# Patient Record
Sex: Female | Born: 1940 | Race: White | Hispanic: No | Marital: Married | Smoking: Never smoker
Health system: Southern US, Community
[De-identification: ages and names within clinical notes are randomized; demographics above are authoritative.]

## PROBLEM LIST (undated history)

## (undated) DIAGNOSIS — E785 Hyperlipidemia, unspecified: Secondary | ICD-10-CM

## (undated) DIAGNOSIS — Z860100 Personal history of colon polyps, unspecified: Secondary | ICD-10-CM

## (undated) DIAGNOSIS — M2540 Effusion, unspecified joint: Secondary | ICD-10-CM

## (undated) DIAGNOSIS — R350 Frequency of micturition: Secondary | ICD-10-CM

## (undated) DIAGNOSIS — Z5189 Encounter for other specified aftercare: Secondary | ICD-10-CM

## (undated) DIAGNOSIS — R351 Nocturia: Secondary | ICD-10-CM

## (undated) DIAGNOSIS — K219 Gastro-esophageal reflux disease without esophagitis: Secondary | ICD-10-CM

## (undated) DIAGNOSIS — M1990 Unspecified osteoarthritis, unspecified site: Secondary | ICD-10-CM

## (undated) DIAGNOSIS — H269 Unspecified cataract: Secondary | ICD-10-CM

## (undated) DIAGNOSIS — M2550 Pain in unspecified joint: Secondary | ICD-10-CM

## (undated) DIAGNOSIS — K5790 Diverticulosis of intestine, part unspecified, without perforation or abscess without bleeding: Secondary | ICD-10-CM

## (undated) DIAGNOSIS — J302 Other seasonal allergic rhinitis: Secondary | ICD-10-CM

## (undated) HISTORY — PX: COLONOSCOPY: SHX174

---

## 1965-07-24 DIAGNOSIS — Z5189 Encounter for other specified aftercare: Secondary | ICD-10-CM

## 1965-07-24 DIAGNOSIS — IMO0001 Reserved for inherently not codable concepts without codable children: Secondary | ICD-10-CM

## 1965-07-24 HISTORY — DX: Reserved for inherently not codable concepts without codable children: IMO0001

## 1965-07-24 HISTORY — DX: Encounter for other specified aftercare: Z51.89

## 1971-07-25 HISTORY — PX: TONSILLECTOMY: SUR1361

## 1973-07-24 HISTORY — PX: BREAST SURGERY: SHX581

## 1997-12-30 ENCOUNTER — Other Ambulatory Visit: Admission: RE | Admit: 1997-12-30 | Discharge: 1997-12-30 | Payer: Self-pay | Admitting: Obstetrics & Gynecology

## 1999-02-23 ENCOUNTER — Other Ambulatory Visit: Admission: RE | Admit: 1999-02-23 | Discharge: 1999-02-23 | Payer: Self-pay | Admitting: Obstetrics & Gynecology

## 2000-03-29 ENCOUNTER — Other Ambulatory Visit: Admission: RE | Admit: 2000-03-29 | Discharge: 2000-03-29 | Payer: Self-pay | Admitting: Obstetrics & Gynecology

## 2001-04-23 ENCOUNTER — Other Ambulatory Visit: Admission: RE | Admit: 2001-04-23 | Discharge: 2001-04-23 | Payer: Self-pay | Admitting: Obstetrics & Gynecology

## 2002-05-19 ENCOUNTER — Other Ambulatory Visit: Admission: RE | Admit: 2002-05-19 | Discharge: 2002-05-19 | Payer: Self-pay | Admitting: Obstetrics & Gynecology

## 2003-05-25 ENCOUNTER — Other Ambulatory Visit: Admission: RE | Admit: 2003-05-25 | Discharge: 2003-05-25 | Payer: Self-pay | Admitting: Obstetrics & Gynecology

## 2003-07-10 ENCOUNTER — Ambulatory Visit (HOSPITAL_COMMUNITY): Admission: RE | Admit: 2003-07-10 | Discharge: 2003-07-10 | Payer: Self-pay | Admitting: Family Medicine

## 2004-06-10 ENCOUNTER — Other Ambulatory Visit: Admission: RE | Admit: 2004-06-10 | Discharge: 2004-06-10 | Payer: Self-pay | Admitting: Obstetrics & Gynecology

## 2005-07-21 ENCOUNTER — Other Ambulatory Visit: Admission: RE | Admit: 2005-07-21 | Discharge: 2005-07-21 | Payer: Self-pay | Admitting: Obstetrics & Gynecology

## 2006-07-16 ENCOUNTER — Emergency Department (HOSPITAL_COMMUNITY): Admission: EM | Admit: 2006-07-16 | Discharge: 2006-07-16 | Payer: Self-pay | Admitting: Emergency Medicine

## 2006-07-20 ENCOUNTER — Ambulatory Visit (HOSPITAL_COMMUNITY): Admission: RE | Admit: 2006-07-20 | Discharge: 2006-07-21 | Payer: Self-pay | Admitting: Surgery

## 2007-07-25 HISTORY — PX: CHOLECYSTECTOMY: SHX55

## 2008-07-24 HISTORY — PX: OTHER SURGICAL HISTORY: SHX169

## 2011-11-20 ENCOUNTER — Encounter (HOSPITAL_COMMUNITY): Payer: Self-pay

## 2011-11-27 ENCOUNTER — Encounter (HOSPITAL_COMMUNITY): Payer: Self-pay

## 2011-11-27 ENCOUNTER — Encounter (HOSPITAL_COMMUNITY)
Admission: RE | Admit: 2011-11-27 | Discharge: 2011-11-27 | Disposition: A | Discharge status: Home / Self Care | Payer: Medicare Other | Source: Ambulatory Visit | Attending: Orthopedic Surgery | Admitting: Orthopedic Surgery

## 2011-11-27 HISTORY — DX: Unspecified cataract: H26.9

## 2011-11-27 HISTORY — DX: Personal history of colon polyps, unspecified: Z86.0100

## 2011-11-27 HISTORY — DX: Gastro-esophageal reflux disease without esophagitis: K21.9

## 2011-11-27 HISTORY — DX: Nocturia: R35.1

## 2011-11-27 HISTORY — DX: Diverticulosis of intestine, part unspecified, without perforation or abscess without bleeding: K57.90

## 2011-11-27 HISTORY — DX: Personal history of colonic polyps: Z86.010

## 2011-11-27 HISTORY — DX: Unspecified osteoarthritis, unspecified site: M19.90

## 2011-11-27 HISTORY — DX: Hyperlipidemia, unspecified: E78.5

## 2011-11-27 HISTORY — DX: Pain in unspecified joint: M25.50

## 2011-11-27 HISTORY — DX: Effusion, unspecified joint: M25.40

## 2011-11-27 HISTORY — DX: Encounter for other specified aftercare: Z51.89

## 2011-11-27 HISTORY — DX: Frequency of micturition: R35.0

## 2011-11-27 HISTORY — DX: Other seasonal allergic rhinitis: J30.2

## 2011-11-27 LAB — URINE MICROSCOPIC-ADD ON

## 2011-11-27 LAB — COMPREHENSIVE METABOLIC PANEL
ALT: 22 U/L (ref 0–35)
AST: 22 U/L (ref 0–37)
CO2: 24 mEq/L (ref 19–32)
Calcium: 10.4 mg/dL (ref 8.4–10.5)
Chloride: 104 mEq/L (ref 96–112)
Creatinine, Ser: 0.78 mg/dL (ref 0.50–1.10)
GFR calc Af Amer: >90 mL/min (ref >90)
GFR calc non Af Amer: 82 mL/min — ABNORMAL LOW (ref >90)
Glucose, Bld: 91 mg/dL (ref 70–99)
Total Bilirubin: 0.8 mg/dL (ref 0.3–1.2)

## 2011-11-27 LAB — CBC
HCT: 42.9 % (ref 36.0–46.0)
Hemoglobin: 14.1 g/dL (ref 12.0–15.0)
MCH: 30.3 pg (ref 26.0–34.0)
MCV: 92.3 fL (ref 78.0–100.0)
Platelets: 179 10*3/uL (ref 150–400)
RBC: 4.65 MIL/uL (ref 3.87–5.11)
WBC: 4.2 10*3/uL (ref 4.0–10.5)

## 2011-11-27 LAB — DIFFERENTIAL
Basophils Absolute: 0 10*3/uL (ref 0.0–0.1)
Basophils Relative: 1 % (ref 0–1)
Eosinophils Relative: 3 % (ref 0–5)
Monocytes Absolute: 0.3 10*3/uL (ref 0.1–1.0)
Neutro Abs: 1.8 10*3/uL (ref 1.7–7.7)

## 2011-11-27 LAB — URINALYSIS, ROUTINE W REFLEX MICROSCOPIC
Bilirubin Urine: NEGATIVE
Glucose, UA: NEGATIVE mg/dL
Specific Gravity, Urine: 1.007 (ref 1.005–1.030)

## 2011-11-27 LAB — PROTIME-INR: Prothrombin Time: 13 seconds (ref 11.6–15.2)

## 2011-11-27 LAB — SURGICAL PCR SCREEN
MRSA, PCR: NEGATIVE
Staphylococcus aureus: NEGATIVE

## 2011-11-27 MED ORDER — CHLORHEXIDINE GLUCONATE 4 % EX LIQD: 60.0000 mL | Freq: Once | CUTANEOUS | Status: DC

## 2011-11-27 NOTE — Pre-Procedure Instructions (Signed)
20 CICI RODRIGES  11/27/2011   Your procedure is scheduled on:  Mon, May 13 @ 0730 AM  Report to [facility] at 0530 AM.  Call this number if you have problems the morning of surgery: [phone]   Remember:   Do not eat food:After Midnight.  May have clear liquids: up to 4 Hours before arrival.(until 1:30 am)  Clear liquids include soda, tea, black coffee, apple or grape juice, broth,water  Take these medicines the morning of surgery with A SIP OF WATER:    Do not wear jewelry, make-up or nail polish.  Do not wear lotions, powders, or perfumes.   Do not bring valuables to the hospital.  Contacts, dentures or bridgework may not be worn into surgery.  Leave suitcase in the car. After surgery it may be brought to your room.  For patients admitted to the hospital, checkout time is 11:00 AM the day of discharge.   Special Instructions: CHG Shower Use Special Wash: 1/2 bottle night before surgery and 1/2 bottle morning of surgery.   Please read over the following fact sheets that you were given: Pain Booklet, Coughing and Deep Breathing, Blood Transfusion Information, Total Joint Packet, MRSA Information and Surgical Site Infection Prevention

## 2011-11-27 NOTE — Progress Notes (Addendum)
Dr.Thomas Wall saw pt 15+yrs ago d/t a womens clinic advertised and pt went  Denies ever having an echo/stress test/heart cath  Medical MD is Dr.William Holley Bouche on [address] with [facility] [employer]

## 2011-12-03 MED ORDER — VANCOMYCIN HCL 1000 MG IV SOLR
1500.0000 mg | INTRAVENOUS | Status: DC
  Filled 2011-12-03: qty 1500

## 2011-12-04 ENCOUNTER — Encounter (HOSPITAL_COMMUNITY): Payer: Self-pay | Admitting: Anesthesiology

## 2011-12-04 ENCOUNTER — Encounter (HOSPITAL_COMMUNITY)
Admission: RE | Disposition: A | Discharge status: Home Health | Payer: Self-pay | Source: Ambulatory Visit | Attending: Orthopedic Surgery

## 2011-12-04 ENCOUNTER — Inpatient Hospital Stay (HOSPITAL_COMMUNITY)
Admission: RE | Admit: 2011-12-04 | Discharge: 2011-12-06 | DRG: 470 | Disposition: A | Discharge status: Home Health | Payer: Medicare Other | Source: Ambulatory Visit | Attending: Orthopedic Surgery | Admitting: Orthopedic Surgery

## 2011-12-04 ENCOUNTER — Encounter (HOSPITAL_COMMUNITY): Payer: Self-pay | Admitting: *Deleted

## 2011-12-04 ENCOUNTER — Ambulatory Visit (HOSPITAL_COMMUNITY): Payer: Medicare Other | Admitting: Anesthesiology

## 2011-12-04 DIAGNOSIS — D62 Acute posthemorrhagic anemia: Secondary | ICD-10-CM | POA: Diagnosis not present

## 2011-12-04 DIAGNOSIS — J309 Allergic rhinitis, unspecified: Secondary | ICD-10-CM | POA: Diagnosis present

## 2011-12-04 DIAGNOSIS — Z01812 Encounter for preprocedural laboratory examination: Secondary | ICD-10-CM

## 2011-12-04 DIAGNOSIS — E785 Hyperlipidemia, unspecified: Secondary | ICD-10-CM | POA: Diagnosis present

## 2011-12-04 DIAGNOSIS — IMO0002 Reserved for concepts with insufficient information to code with codable children: Principal | ICD-10-CM | POA: Diagnosis present

## 2011-12-04 DIAGNOSIS — Z7982 Long term (current) use of aspirin: Secondary | ICD-10-CM

## 2011-12-04 DIAGNOSIS — M1711 Unilateral primary osteoarthritis, right knee: Secondary | ICD-10-CM

## 2011-12-04 DIAGNOSIS — M1710 Unilateral primary osteoarthritis, unspecified knee: Principal | ICD-10-CM | POA: Diagnosis present

## 2011-12-04 HISTORY — PX: TOTAL KNEE ARTHROPLASTY: SHX125

## 2011-12-04 SURGERY — ARTHROPLASTY, KNEE, TOTAL
Anesthesia: General | Site: Knee | Laterality: Right | Wound class: Clean

## 2011-12-04 MED ORDER — METHOCARBAMOL 500 MG PO TABS
500.0000 mg | ORAL_TABLET | Freq: Four times a day (QID) | ORAL | Status: DC | PRN
  Administered 2011-12-04 (×3): 500 mg via ORAL
  Filled 2011-12-04 (×3): qty 1

## 2011-12-04 MED ORDER — ACETAMINOPHEN 10 MG/ML IV SOLN
INTRAVENOUS | Status: AC
  Filled 2011-12-04: qty 100

## 2011-12-04 MED ORDER — VANCOMYCIN HCL 1000 MG IV SOLR
1000.0000 mg | INTRAVENOUS | Status: DC | PRN
  Administered 2011-12-04: 1500 mg via INTRAVENOUS

## 2011-12-04 MED ORDER — ONDANSETRON HCL 4 MG/2ML IJ SOLN
4.0000 mg | Freq: Four times a day (QID) | INTRAMUSCULAR | Status: DC | PRN
  Administered 2011-12-04 – 2011-12-05 (×2): 4 mg via INTRAVENOUS
  Filled 2011-12-04 (×3): qty 2

## 2011-12-04 MED ORDER — OXYCODONE HCL 5 MG PO TABS
5.0000 mg | ORAL_TABLET | ORAL | Status: DC | PRN
  Administered 2011-12-04 – 2011-12-06 (×12): 10 mg via ORAL
  Filled 2011-12-04 (×11): qty 2

## 2011-12-04 MED ORDER — DIPHENHYDRAMINE HCL 12.5 MG/5ML PO ELIX
12.5000 mg | ORAL_SOLUTION | ORAL | Status: DC | PRN
  Administered 2011-12-04 – 2011-12-06 (×8): 25 mg via ORAL
  Filled 2011-12-04 (×9): qty 10

## 2011-12-04 MED ORDER — VANCOMYCIN HCL IN DEXTROSE 1-5 GM/200ML-% IV SOLN
1000.0000 mg | Freq: Two times a day (BID) | INTRAVENOUS | Status: AC
  Administered 2011-12-04: 1000 mg via INTRAVENOUS
  Filled 2011-12-04: qty 200

## 2011-12-04 MED ORDER — SODIUM CHLORIDE 0.9 % IV SOLN: INTRAVENOUS | Status: DC

## 2011-12-04 MED ORDER — OXYCODONE HCL 10 MG PO TB12
10.0000 mg | ORAL_TABLET | Freq: Two times a day (BID) | ORAL | Status: DC
  Administered 2011-12-04 – 2011-12-06 (×5): 10 mg via ORAL
  Filled 2011-12-04 (×5): qty 1

## 2011-12-04 MED ORDER — ACETAMINOPHEN 325 MG PO TABS: 650.0000 mg | ORAL_TABLET | Freq: Four times a day (QID) | ORAL | Status: DC | PRN

## 2011-12-04 MED ORDER — PROPOFOL 10 MG/ML IV EMUL
INTRAVENOUS | Status: DC | PRN
  Administered 2011-12-04: 200 mg via INTRAVENOUS

## 2011-12-04 MED ORDER — ONDANSETRON HCL 4 MG PO TABS: 4.0000 mg | ORAL_TABLET | Freq: Four times a day (QID) | ORAL | Status: DC | PRN

## 2011-12-04 MED ORDER — BISACODYL 5 MG PO TBEC: 5.0000 mg | DELAYED_RELEASE_TABLET | Freq: Every day | ORAL | Status: DC | PRN

## 2011-12-04 MED ORDER — ALUM & MAG HYDROXIDE-SIMETH 200-200-20 MG/5ML PO SUSP: 30.0000 mL | ORAL | Status: DC | PRN

## 2011-12-04 MED ORDER — METHOCARBAMOL 100 MG/ML IJ SOLN
500.0000 mg | Freq: Four times a day (QID) | INTRAVENOUS | Status: DC | PRN
  Filled 2011-12-04: qty 5

## 2011-12-04 MED ORDER — HYDROMORPHONE HCL PF 1 MG/ML IJ SOLN
INTRAMUSCULAR | Status: AC
  Filled 2011-12-04: qty 1

## 2011-12-04 MED ORDER — MIDAZOLAM HCL 5 MG/5ML IJ SOLN
INTRAMUSCULAR | Status: DC | PRN
  Administered 2011-12-04: 2 mg via INTRAVENOUS

## 2011-12-04 MED ORDER — METOCLOPRAMIDE HCL 5 MG/ML IJ SOLN: 5.0000 mg | Freq: Three times a day (TID) | INTRAMUSCULAR | Status: DC | PRN

## 2011-12-04 MED ORDER — CELECOXIB 200 MG PO CAPS
200.0000 mg | ORAL_CAPSULE | Freq: Two times a day (BID) | ORAL | Status: DC
  Administered 2011-12-04 – 2011-12-06 (×5): 200 mg via ORAL
  Filled 2011-12-04 (×6): qty 1

## 2011-12-04 MED ORDER — MEPERIDINE HCL 25 MG/ML IJ SOLN: 6.2500 mg | INTRAMUSCULAR | Status: DC | PRN

## 2011-12-04 MED ORDER — LACTATED RINGERS IV SOLN
INTRAVENOUS | Status: DC | PRN
  Administered 2011-12-04 (×2): via INTRAVENOUS

## 2011-12-04 MED ORDER — LORATADINE 10 MG PO TABS: 10.0000 mg | ORAL_TABLET | Freq: Every day | ORAL | Status: DC

## 2011-12-04 MED ORDER — DIPHENHYDRAMINE HCL 50 MG/ML IJ SOLN: 12.5000 mg | Freq: Four times a day (QID) | INTRAMUSCULAR | Status: DC | PRN

## 2011-12-04 MED ORDER — ENOXAPARIN SODIUM 30 MG/0.3ML ~~LOC~~ SOLN
30.0000 mg | Freq: Two times a day (BID) | SUBCUTANEOUS | Status: DC
  Administered 2011-12-05 – 2011-12-06 (×3): 30 mg via SUBCUTANEOUS
  Filled 2011-12-04 (×5): qty 0.3

## 2011-12-04 MED ORDER — EPHEDRINE SULFATE 50 MG/ML IJ SOLN
INTRAMUSCULAR | Status: DC | PRN
  Administered 2011-12-04: 10 mg via INTRAVENOUS
  Administered 2011-12-04: 5 mg via INTRAVENOUS

## 2011-12-04 MED ORDER — BUPIVACAINE-EPINEPHRINE PF 0.25-1:200000 % IJ SOLN
INTRAMUSCULAR | Status: DC | PRN
  Administered 2011-12-04: 20 mL

## 2011-12-04 MED ORDER — PHENOL 1.4 % MT LIQD: 1.0000 | OROMUCOSAL | Status: DC | PRN

## 2011-12-04 MED ORDER — BUPIVACAINE 0.25 % ON-Q PUMP SINGLE CATH 300ML
INJECTION | Status: DC | PRN
  Administered 2011-12-04: 300 mL

## 2011-12-04 MED ORDER — ROPIVACAINE HCL 5 MG/ML IJ SOLN
INTRAMUSCULAR | Status: DC | PRN
  Administered 2011-12-04: 30 mL

## 2011-12-04 MED ORDER — MENTHOL 3 MG MT LOZG: 1.0000 | LOZENGE | OROMUCOSAL | Status: DC | PRN

## 2011-12-04 MED ORDER — ACETAMINOPHEN 650 MG RE SUPP: 650.0000 mg | Freq: Four times a day (QID) | RECTAL | Status: DC | PRN

## 2011-12-04 MED ORDER — FLEET ENEMA 7-19 GM/118ML RE ENEM: 1.0000 | ENEMA | Freq: Once | RECTAL | Status: AC | PRN

## 2011-12-04 MED ORDER — DIPHENHYDRAMINE HCL 50 MG/ML IJ SOLN
INTRAMUSCULAR | Status: AC
  Administered 2011-12-04: 12.5 mg
  Filled 2011-12-04: qty 1

## 2011-12-04 MED ORDER — ACETAMINOPHEN 10 MG/ML IV SOLN
INTRAVENOUS | Status: DC | PRN
  Administered 2011-12-04: 1000 mg via INTRAVENOUS

## 2011-12-04 MED ORDER — SIMVASTATIN 40 MG PO TABS: 40.0000 mg | ORAL_TABLET | Freq: Every day | ORAL | Status: DC

## 2011-12-04 MED ORDER — SENNOSIDES-DOCUSATE SODIUM 8.6-50 MG PO TABS: 1.0000 | ORAL_TABLET | Freq: Every evening | ORAL | Status: DC | PRN

## 2011-12-04 MED ORDER — DOCUSATE SODIUM 100 MG PO CAPS
100.0000 mg | ORAL_CAPSULE | Freq: Two times a day (BID) | ORAL | Status: DC
  Administered 2011-12-04 – 2011-12-06 (×4): 100 mg via ORAL
  Filled 2011-12-04 (×6): qty 1

## 2011-12-04 MED ORDER — SIMVASTATIN 40 MG PO TABS
40.0000 mg | ORAL_TABLET | Freq: Every day | ORAL | Status: DC
  Administered 2011-12-05: 40 mg via ORAL
  Filled 2011-12-04 (×3): qty 1

## 2011-12-04 MED ORDER — LIDOCAINE HCL (CARDIAC) 20 MG/ML IV SOLN
INTRAVENOUS | Status: DC | PRN
  Administered 2011-12-04: 100 mg via INTRAVENOUS

## 2011-12-04 MED ORDER — ZOLPIDEM TARTRATE 5 MG PO TABS: 5.0000 mg | ORAL_TABLET | Freq: Every evening | ORAL | Status: DC | PRN

## 2011-12-04 MED ORDER — LORATADINE 10 MG PO TABS
10.0000 mg | ORAL_TABLET | Freq: Every day | ORAL | Status: DC
  Administered 2011-12-05 – 2011-12-06 (×2): 10 mg via ORAL
  Filled 2011-12-04 (×3): qty 1

## 2011-12-04 MED ORDER — HYDROMORPHONE HCL PF 1 MG/ML IJ SOLN
0.2500 mg | INTRAMUSCULAR | Status: DC | PRN
Start: 2011-12-04 — End: 2011-12-04
  Administered 2011-12-04: 0.5 mg via INTRAVENOUS

## 2011-12-04 MED ORDER — PROMETHAZINE HCL 25 MG/ML IJ SOLN: 6.2500 mg | INTRAMUSCULAR | Status: DC | PRN

## 2011-12-04 MED ORDER — METOCLOPRAMIDE HCL 10 MG PO TABS: 5.0000 mg | ORAL_TABLET | Freq: Three times a day (TID) | ORAL | Status: DC | PRN

## 2011-12-04 MED ORDER — SODIUM CHLORIDE 0.9 % IR SOLN
Status: DC | PRN
  Administered 2011-12-04: 1000 mL

## 2011-12-04 MED ORDER — FENTANYL CITRATE 0.05 MG/ML IJ SOLN
INTRAMUSCULAR | Status: DC | PRN
  Administered 2011-12-04 (×5): 50 ug via INTRAVENOUS

## 2011-12-04 MED ORDER — HYDROMORPHONE HCL PF 1 MG/ML IJ SOLN
0.5000 mg | INTRAMUSCULAR | Status: DC | PRN
  Administered 2011-12-04 (×2): 1 mg via INTRAVENOUS
  Administered 2011-12-04: 0.5 mg via INTRAVENOUS
  Filled 2011-12-04 (×2): qty 1

## 2011-12-04 MED ORDER — BUPIVACAINE ON-Q PAIN PUMP (FOR ORDER SET NO CHG)
INJECTION | Status: DC
  Filled 2011-12-04: qty 1

## 2011-12-04 MED ORDER — LACTATED RINGERS IV SOLN: INTRAVENOUS | Status: DC

## 2011-12-04 MED ORDER — BUPIVACAINE 0.25 % ON-Q PUMP SINGLE CATH 300ML
300.0000 mL | INJECTION | Status: DC
  Filled 2011-12-04: qty 300

## 2011-12-04 MED ORDER — HYDROMORPHONE HCL PF 1 MG/ML IJ SOLN
0.2500 mg | INTRAMUSCULAR | Status: DC | PRN
  Administered 2011-12-04 (×6): 0.5 mg via INTRAVENOUS

## 2011-12-04 SURGICAL SUPPLY — 61 items
BANDAGE ELASTIC 4 VELCRO ST LF (GAUZE/BANDAGES/DRESSINGS) ×2 IMPLANT
BANDAGE ELASTIC 6 VELCRO ST LF (GAUZE/BANDAGES/DRESSINGS) ×2 IMPLANT
BANDAGE ESMARK 6X9 LF (GAUZE/BANDAGES/DRESSINGS) ×1 IMPLANT
BLADE SAGITTAL 13X1.27X60 (BLADE) ×2 IMPLANT
BLADE SAW SGTL 83.5X18.5 (BLADE) ×2 IMPLANT
BNDG ESMARK 6X9 LF (GAUZE/BANDAGES/DRESSINGS) ×2
BOWL SMART MIX CTS (DISPOSABLE) ×4 IMPLANT
CATH KIT ON Q 5IN SLV (PAIN MANAGEMENT) ×2 IMPLANT
CEMENT BONE SIMPLEX SPEEDSET (Cement) ×2 IMPLANT
CLOTH BEACON ORANGE TIMEOUT ST (SAFETY) ×2 IMPLANT
COVER BACK TABLE 24X17X13 BIG (DRAPES) ×2 IMPLANT
COVER SURGICAL LIGHT HANDLE (MISCELLANEOUS) ×4 IMPLANT
CUFF TOURNIQUET SINGLE 34IN LL (TOURNIQUET CUFF) ×2 IMPLANT
DRAPE EXTREMITY T 121X128X90 (DRAPE) ×2 IMPLANT
DRAPE INCISE IOBAN 66X45 STRL (DRAPES) ×4 IMPLANT
DRAPE PROXIMA HALF (DRAPES) ×2 IMPLANT
DRAPE U-SHAPE 47X51 STRL (DRAPES) ×2 IMPLANT
DRSG ADAPTIC 3X8 NADH LF (GAUZE/BANDAGES/DRESSINGS) ×2 IMPLANT
DRSG PAD ABDOMINAL 8X10 ST (GAUZE/BANDAGES/DRESSINGS) ×2 IMPLANT
DURAPREP 26ML APPLICATOR (WOUND CARE) ×4 IMPLANT
ELECT REM PT RETURN 9FT ADLT (ELECTROSURGICAL) ×2
ELECTRODE REM PT RTRN 9FT ADLT (ELECTROSURGICAL) ×1 IMPLANT
EVACUATOR 1/8 PVC DRAIN (DRAIN) ×2 IMPLANT
GLOVE BIOGEL M 7.0 STRL (GLOVE) IMPLANT
GLOVE BIOGEL PI IND STRL 7.5 (GLOVE) IMPLANT
GLOVE BIOGEL PI IND STRL 8.5 (GLOVE) ×2 IMPLANT
GLOVE BIOGEL PI INDICATOR 7.5 (GLOVE)
GLOVE BIOGEL PI INDICATOR 8.5 (GLOVE) ×2
GLOVE SURG ORTHO 8.0 STRL STRW (GLOVE) ×4 IMPLANT
GOWN PREVENTION PLUS XLARGE (GOWN DISPOSABLE) ×4 IMPLANT
GOWN STRL NON-REIN LRG LVL3 (GOWN DISPOSABLE) ×4 IMPLANT
HANDPIECE INTERPULSE COAX TIP (DISPOSABLE) ×1
HOOD PEEL AWAY FACE SHEILD DIS (HOOD) ×6 IMPLANT
KIT BASIN OR (CUSTOM PROCEDURE TRAY) ×2 IMPLANT
KIT ROOM TURNOVER OR (KITS) ×2 IMPLANT
MANIFOLD NEPTUNE II (INSTRUMENTS) ×2 IMPLANT
NEEDLE 22X1 1/2 (OR ONLY) (NEEDLE) IMPLANT
NS IRRIG 1000ML POUR BTL (IV SOLUTION) ×2 IMPLANT
PACK TOTAL JOINT (CUSTOM PROCEDURE TRAY) ×2 IMPLANT
PAD ARMBOARD 7.5X6 YLW CONV (MISCELLANEOUS) ×4 IMPLANT
PAD CAST 4YDX4 CTTN HI CHSV (CAST SUPPLIES) ×1 IMPLANT
PADDING CAST COTTON 4X4 STRL (CAST SUPPLIES) ×1
PADDING CAST COTTON 6X4 STRL (CAST SUPPLIES) ×2 IMPLANT
POSITIONER HEAD PRONE TRACH (MISCELLANEOUS) ×2 IMPLANT
SET HNDPC FAN SPRY TIP SCT (DISPOSABLE) ×1 IMPLANT
SPONGE GAUZE 4X4 12PLY (GAUZE/BANDAGES/DRESSINGS) ×2 IMPLANT
STAPLER VISISTAT 35W (STAPLE) ×2 IMPLANT
SUCTION FRAZIER TIP 10 FR DISP (SUCTIONS) ×2 IMPLANT
SUT BONE WAX W31G (SUTURE) ×2 IMPLANT
SUT VIC AB 0 CTB1 27 (SUTURE) ×4 IMPLANT
SUT VIC AB 1 CT1 27 (SUTURE) ×1
SUT VIC AB 1 CT1 27XBRD ANBCTR (SUTURE) ×1 IMPLANT
SUT VIC AB 2-0 CT1 27 (SUTURE) ×2
SUT VIC AB 2-0 CT1 TAPERPNT 27 (SUTURE) ×2 IMPLANT
SUT VLOC 180 0 24IN GS25 (SUTURE) ×2 IMPLANT
SYR CONTROL 10ML LL (SYRINGE) IMPLANT
TOWEL OR 17X24 6PK STRL BLUE (TOWEL DISPOSABLE) ×2 IMPLANT
TOWEL OR 17X26 10 PK STRL BLUE (TOWEL DISPOSABLE) ×2 IMPLANT
TRAY FOLEY CATH 14FR (SET/KITS/TRAYS/PACK) ×2 IMPLANT
WATER STERILE IRR 1000ML POUR (IV SOLUTION) ×6 IMPLANT
YANKAUER SUCT BULB TIP NO VENT (SUCTIONS) ×2 IMPLANT

## 2011-12-04 NOTE — Progress Notes (Signed)
Pt is requesting "as much pain medicine as you can give me". Gave pt po pain meds and within 90 seconds, she was vomiting. Once she vomited, she states that she feels better. PO pain meds were not seen in emesis. Diet backed down to clear liquids. Zofran given. Will continue to attempt to manage pts pain.  Tammy Sours

## 2011-12-04 NOTE — H&P (Signed)
Yvonne Vargas MRN:  [id number] DOB/SEX:  Nov 09, 1940/female  CHIEF COMPLAINT:  Painful right Knee  HISTORY: Patient is a 71 y.o. female presented with a history of pain in the right knee. Onset of symptoms was gradual starting several years ago with gradually worsening course since that time. The patient noted no past surgery on the right knee. Prior procedures on the knee include arthroscopy. Patient has been treated conservatively with over-the-counter NSAIDs and activity modification. Patient currently rates pain in the knee at 9 out of 10 with activity. There is no pain at night.  PAST MEDICAL HISTORY: There are no active problems to display for this patient.  Past Medical History  Diagnosis Date  . Hyperlipidemia     takes Simvastatin daily  . Seasonal allergies     takes Loratadine daily  . Arthritis   . Joint pain   . Joint swelling   . GERD (gastroesophageal reflux disease)     rarely but doesn't require meds  . Hemorrhoids   . Diverticulosis   . History of colonic polyps   . Urinary frequency   . Nocturia   . Blood transfusion 1967    after birth of 1st child  . Cataracts, bilateral     immature   Past Surgical History  Procedure Date  . Tonsillectomy 1973  . Cholecystectomy 2009  . Anal polpys 2010    done in office  . Breast surgery 1975    right febrile tumor non malignant  . Colonoscopy      MEDICATIONS:   Prescriptions prior to admission  Medication Sig Dispense Refill  . aspirin EC 81 MG tablet Take 81 mg by mouth daily.      . Calcium Carbonate-Vitamin D (CALCIUM PLUS VITAMIN D PO) Take 1 tablet by mouth 2 (two) times daily.      . Cholecalciferol (VITAMIN D3) 2000 UNITS capsule Take 2,000 Units by mouth daily.      . Glucos-Chond-Sterol-Fish Oil (GLUCOSAMINE CHONDROITIN PLUS PO) Take 1 tablet by mouth 2 (two) times daily.      Marland Kitchen loratadine (CLARITIN) 10 MG tablet Take 10 mg by mouth daily.      . simvastatin (ZOCOR) 40 MG tablet Take 40 mg by mouth  every evening.        ALLERGIES:   Allergies  Allergen Reactions  . Clindamycin/Lincomycin Other (See Comments)  . Codeine Nausea And Vomiting  . Penicillins Rash    REVIEW OF SYSTEMS:  Pertinent items are noted in HPI.   FAMILY HISTORY:   Family History  Problem Relation Age of Onset  . Anesthesia problems Neg Hx   . Hypotension Neg Hx   . Malignant hyperthermia Neg Hx   . Pseudochol deficiency Neg Hx     SOCIAL HISTORY:   History  Substance Use Topics  . Smoking status: Never Smoker   . Smokeless tobacco: Not on file  . Alcohol Use: No     EXAMINATION:  Vital signs in last 24 hours: Temp:  [98 F (36.7 C)] 98 F (36.7 C) (05/13 7829) Pulse Rate:  [80] 80  (05/13 0608) Resp:  [18] 18  (05/13 0608) BP: (120)/(78) 120/78 mmHg (05/13 0608) SpO2:  [95 %] 95 % (05/13 0608)  General appearance: alert, cooperative and no distress Lungs: clear to auscultation bilaterally Heart: regular rate and rhythm, S1, S2 normal, no murmur, click, rub or gallop Abdomen: soft, non-tender; bowel sounds normal; no masses,  no organomegaly Extremities: extremities normal, atraumatic, no cyanosis or edema  and Homans sign is negative, no sign of DVT Pulses: 2+ and symmetric Skin: Skin color, texture, turgor normal. No rashes or lesions Neurologic: Alert and oriented X 3, normal strength and tone. Normal symmetric reflexes. Normal coordination and gait  Musculoskeletal:  ROM 0-115, Ligaments intact,  Imaging Review Plain radiographs demonstrate severe degenerative joint disease of the right knee. The overall alignment is mild valgus. The bone quality appears to be good for age and reported activity level.  Assessment/Plan: End stage arthritis, right knee   The patient history, physical examination and imaging studies are consistent with advanced degenerative joint disease of the right knee. The patient has failed conservative treatment.  The clearance notes were reviewed.  After  discussion with the patient it was felt that Total Knee Replacement was indicated. The procedure,  risks, and benefits of total knee arthroplasty were presented and reviewed. The risks including but not limited to aseptic loosening, infection, blood clots, vascular injury, stiffness, patella tracking problems complications among others were discussed. The patient acknowledged the explanation, agreed to proceed with the plan.  Shaylea Ucci 12/04/2011, 7:09 AM

## 2011-12-04 NOTE — Anesthesia Procedure Notes (Signed)
Anesthesia Regional Block:  Femoral nerve block  Pre-Anesthetic Checklist: ,, timeout performed, Correct Patient, Correct Site, Correct Laterality, Correct Procedure, Correct Position, site marked, Risks and benefits discussed,  Surgical consent,  Pre-op evaluation,  At surgeon's request and post-op pain management  Laterality: Right  Prep: chloraprep       Needles:  Injection technique: Single-shot  Needle Type: Stimiplex     Needle Length: 10cm 10 cm Needle Gauge: 21 G    Additional Needles:  Procedures: ultrasound guided and nerve stimulator Femoral nerve block Narrative:   Performed by: Personally  Anesthesiologist: Navie Lamoreaux MD  Additional Notes: Patient tolerated the procedure well without complications  Femoral nerve block   

## 2011-12-04 NOTE — Anesthesia Preprocedure Evaluation (Addendum)
Anesthesia Evaluation  Patient identified by MRN, date of birth, ID band Patient awake    Reviewed: Allergy & Precautions, H&P , NPO status , Patient's Chart, lab work & pertinent test results  History of Anesthesia Complications Negative for: history of anesthetic complications  Airway Mallampati: II TM Distance: >3 FB Neck ROM: Full    Dental No notable dental hx. (+) Teeth Intact and Dental Advisory Given   Pulmonary neg pulmonary ROS,  breath sounds clear to auscultation  Pulmonary exam normal       Cardiovascular negative cardio ROS  + dysrhythmias (PAC's) + Cardiac Defibrillator Rhythm:Regular Rate:Normal     Neuro/Psych negative neurological ROS  negative psych ROS   GI/Hepatic negative GI ROS, Neg liver ROS, GERD-  ,  Endo/Other  negative endocrine ROS  Renal/GU negative Renal ROS  negative genitourinary   Musculoskeletal negative musculoskeletal ROS (+)   Abdominal   Peds negative pediatric ROS (+)  Hematology negative hematology ROS (+)   Anesthesia Other Findings   Reproductive/Obstetrics negative OB ROS                        Anesthesia Physical Anesthesia Plan  ASA: II  Anesthesia Plan: General and Regional   Post-op Pain Management:    Induction: Intravenous  Airway Management Planned: LMA  Additional Equipment:   Intra-op Plan:   Post-operative Plan: Extubation in OR  Informed Consent: I have reviewed the patients History and Physical, chart, labs and discussed the procedure including the risks, benefits and alternatives for the proposed anesthesia with the patient or authorized representative who has indicated his/her understanding and acceptance.   Dental advisory given  Plan Discussed with: CRNA  Anesthesia Plan Comments:        Anesthesia Quick Evaluation

## 2011-12-04 NOTE — Plan of Care (Signed)
Problem: Consults Goal: Diagnosis- Total Joint Replacement Outcome: Completed/Met Date Met:  12/04/11 Primary Total Knee Right

## 2011-12-04 NOTE — Progress Notes (Signed)
Orthopedic Tech Progress Note Patient Details:  Yvonne Vargas [birth year] [id number]  CPM Right Knee CPM Right Knee: On Right Knee Flexion (Degrees): 0  Right Knee Extension (Degrees): 90  Additional Comments: trapeze bar   Cammer, Mickie Bail 12/04/2011, 11:00 AM

## 2011-12-04 NOTE — Preoperative (Signed)
Beta Blockers   Reason not to administer Beta Blockers:Not Applicable 

## 2011-12-04 NOTE — Transfer of Care (Signed)
Immediate Anesthesia Transfer of Care Note  Patient: Yvonne Vargas  Procedure(s) Performed: Procedure(s) (LRB): TOTAL KNEE ARTHROPLASTY (Right)  Patient Location: PACU  Anesthesia Type: GA combined with regional for post-op pain  Level of Consciousness: awake, alert  and oriented  Airway & Oxygen Therapy: Patient Spontanous Breathing and Patient connected to nasal cannula oxygen  Post-op Assessment: Report given to PACU RN and Post -op Vital signs reviewed and stable  Post vital signs: Reviewed and stable  Complications: No apparent anesthesia complications

## 2011-12-04 NOTE — Anesthesia Postprocedure Evaluation (Signed)
  Anesthesia Post-op Note  Patient: Yvonne Vargas  Procedure(s) Performed: Procedure(s) (LRB): TOTAL KNEE ARTHROPLASTY (Right)  Patient Location: PACU  Anesthesia Type: General  Level of Consciousness: awake and alert   Airway and Oxygen Therapy: Patient Spontanous Breathing  Post-op Pain: mild  Post-op Assessment: Post-op Vital signs reviewed, Patient's Cardiovascular Status Stable, Respiratory Function Stable, Patent Airway and No signs of Nausea or vomiting  Post-op Vital Signs: stable  Complications: No apparent anesthesia complications

## 2011-12-05 ENCOUNTER — Encounter (HOSPITAL_COMMUNITY): Payer: Self-pay | Admitting: Orthopedic Surgery

## 2011-12-05 LAB — CBC
Hemoglobin: 10.8 g/dL — ABNORMAL LOW (ref 12.0–15.0)
MCH: 30 pg (ref 26.0–34.0)
MCV: 93.9 fL (ref 78.0–100.0)
RBC: 3.6 MIL/uL — ABNORMAL LOW (ref 3.87–5.11)
WBC: 6.3 10*3/uL (ref 4.0–10.5)

## 2011-12-05 LAB — BASIC METABOLIC PANEL
CO2: 27 mEq/L (ref 19–32)
Calcium: 9.3 mg/dL (ref 8.4–10.5)
Chloride: 102 mEq/L (ref 96–112)
Glucose, Bld: 118 mg/dL — ABNORMAL HIGH (ref 70–99)
Sodium: 138 mEq/L (ref 135–145)

## 2011-12-05 NOTE — Progress Notes (Signed)
Physical Therapy Evaluation Note  Past Medical History  Diagnosis Date  . Hyperlipidemia     takes Simvastatin daily  . Seasonal allergies     takes Loratadine daily  . Arthritis   . Joint pain   . Joint swelling   . GERD (gastroesophageal reflux disease)     rarely but doesn't require meds  . Hemorrhoids   . Diverticulosis   . History of colonic polyps   . Urinary frequency   . Nocturia   . Blood transfusion 1967    after birth of 1st child  . Cataracts, bilateral     immature   Past Surgical History  Procedure Date  . Tonsillectomy 1973  . Cholecystectomy 2009  . Anal polpys 2010    done in office  . Breast surgery 1975    right febrile tumor non malignant  . Colonoscopy      12/05/11 0750  PT Visit Information  Last PT Received On 12/05/11  Assistance Needed +2 (for safety/lines/chair follow)  PT Time Calculation  PT Start Time 0750  PT Stop Time 0813  PT Time Calculation (min) 23 min  Subjective Data  Subjective Pt received sitting EOB with c/o 6/10 R knee pain and nausea. Patient also reports "I'm burning up."  Precautions  Precautions Knee  Restrictions  RLE Weight Bearing WBAT  Home Living  Lives With Spouse;Son  Available Help at Discharge Available 24 hours/day  Type of Home House  Home Access Ramped entrance  Entrance Stairs-Number of Steps 14 (from the garage)  Home Layout One level  Bathroom Shower/Tub Tub/shower unit;Walk-in Pension scheme manager Yes  How Accessible Accessible via walker  Home Adaptive Equipment Bedside commode/3-in-1;Walker - rolling  Prior Function  Level of Independence Independent  Able to Take Stairs? Yes  Driving Yes  Vocation Retired  Geneticist, molecular No difficulties  Cognition  Overall Cognitive Status Appears within functional limits for tasks assessed/performed  Arousal/Alertness Awake/alert  Orientation Level Oriented X4 / Intact  Behavior During Session [facility]  for tasks performed  Right Upper Extremity Assessment  RUE ROM/Strength/Tone WFL  Left Upper Extremity Assessment  LUE ROM/Strength/Tone WFL  Right Lower Extremity Assessment  RLE ROM/Strength/Tone Due to pain;Deficits  RLE ROM/Strength/Tone Deficits pt able to initiate LAQ, tolerated approx 60 deg flexion  Left Lower Extremity Assessment  LLE ROM/Strength/Tone WFL  Trunk Assessment  Trunk Assessment Normal  Bed Mobility  Bed Mobility Not assessed (pt received sitting up at EOB, RN reports she required minimal assist to transfer self to EOB with HOB elevated)  Transfers  Transfers Sit to Stand;Stand to Sit  Sit to Stand 3: Mod assist;With upper extremity assist;From bed  Stand to Sit 4: Min assist;With upper extremity assist;To chair/3-in-1;With armrests  Details for Transfer Assistance v/c's for hand placement and R LE management  Ambulation/Gait  Ambulation/Gait Assistance 4: Min assist  Ambulation Distance (Feet) 15 Feet  Assistive device Rolling walker  Ambulation/Gait Assistance Details max verbal directional/sequencing cues. Pt with no episodes of R knee buckling. Pt c/o "I'm burning up and just feel so weak." "my arms feel so weak." Pt required max encouragement to con't ambulation.  Gait Pattern Step-to pattern;Decreased step length - right;Decreased stance time - right;Antalgic  Gait velocity slow  Stairs No  Exercises  Exercises Total Joint (handout provided)  Total Joint Exercises  Ankle Circles/Pumps AROM;Both;5 reps;Seated (with LEs elevated)  Quad Sets AROM;Right;5 reps;Seated (with LEs elevated)  Long Arc Quad AROM;Right;10 reps;Seated (achieved approx  50% of full range)  Goniometric ROM 55  PT - End of Session  Equipment Utilized During Treatment Gait belt  Activity Tolerance Patient limited by fatigue (limited by nausea/weakness)  Patient left in chair;with call bell/phone within reach (LEs elevated)  Nurse Communication Mobility status  CPM Right Knee    CPM Right Knee Off  PT Assessment  Clinical Impression Statement Pt s/p R TKA presenting with increased R knee pain, decreased R LE strength and knee ROM. Patient also limited by nausea and fatigue. Patient reports desire to return home. Patient will need to make significant progress to be able to safely return home with spouse.  PT Recommendation/Assessment Patient needs continued PT services  PT Problem List Decreased strength;Decreased range of motion;Decreased activity tolerance;Decreased balance;Decreased mobility  Barriers to Discharge None  PT Therapy Diagnosis  Difficulty walking;Abnormality of gait;Generalized weakness;Acute pain  PT Plan  PT Frequency 7X/week  PT Treatment/Interventions DME instruction;Gait training;Stair training;Functional mobility training;Therapeutic activities;Therapeutic exercise  PT Recommendation  Follow Up Recommendations Home health PT;Supervision/Assistance - 24 hour  Equipment Recommended Tub/shower bench  Individuals Consulted  Consulted and Agree with Results and Recommendations Patient  Acute Rehab PT Goals  PT Goal Formulation With patient  Time For Goal Achievement 12/12/11  Potential to Achieve Goals Good  Pt will go Supine/Side to Sit with modified independence;with HOB 0 degrees  PT Goal: Supine/Side to Sit - Progress Goal set today  Pt will go Sit to Supine/Side with modified independence;with HOB 0 degrees  PT Goal: Sit to Supine/Side - Progress Goal set today  Pt will go Sit to Stand with modified independence;with upper extremity assist (up to RW.)  PT Goal: Sit to Stand - Progress Goal set today  Pt will Transfer Bed to Chair/Chair to Bed with supervision (with RW.)  PT Transfer Goal: Bed to Chair/Chair to Bed - Progress Goal set today  Pt will Ambulate >150 feet;with modified independence;with rolling walker  PT Goal: Ambulate - Progress Goal set today  Pt will Perform Home Exercise Program Independently  PT Goal: Perform Home  Exercise Program - Progress Goal set today  Written Expression  Dominant Hand Right    Pain: 6/10 R knee  Lewis Shock, PT, DPT Pager #: [phone] Office #: [phone]

## 2011-12-05 NOTE — Progress Notes (Signed)
PT Progress Note:     12/05/11 1100  PT Visit Information  Last PT Received On 12/05/11  Assistance Needed +2 (+2 for safety/lines/chair to follow)  PT/OT Co-Evaluation/Treatment Yes  PT Time Calculation  PT Start Time 1111  PT Stop Time 1134  PT Time Calculation (min) 23 min  Precautions  Precautions Knee  Restrictions  RLE Weight Bearing WBAT  Cognition  Overall Cognitive Status Appears within functional limits for tasks assessed/performed  Arousal/Alertness Awake/alert  Orientation Level Oriented X4 / Intact  Behavior During Session [facility] for tasks performed  Bed Mobility  Bed Mobility Not assessed  Transfers  Transfers Sit to Stand;Stand to Sit  Sit to Stand 4: Min assist;With upper extremity assist;With armrests;From chair/3-in-1  Stand to Sit 4: Min assist;With upper extremity assist;With armrests;To chair/3-in-1  Details for Transfer Assistance (A) to achieve standing, balance, & control descent.  Cues for hand placement & RLE positioning when sitting.   Ambulation/Gait  Ambulation/Gait Assistance 4: Min guard  Ambulation Distance (Feet) 40 Feet  Assistive device Rolling walker  Ambulation/Gait Assistance Details Cues to decrease reliance of UE's on RW, upright posture, minor cues for sequencing initially.  Pt c/o UE fatigue.  Strong encouragement to increase distance.   Gait Pattern Step-to pattern;Decreased stance time - right;Decreased step length - left  Stairs No  Exercises  Exercises Total Joint  Total Joint Exercises  Ankle Circles/Pumps AROM;Both;10 reps;Seated  Quad Sets AROM;Both;10 reps;Seated  Long Arc Quad AROM;Both;Right;10 reps;Seated  Straight Leg Raises AROM;Strengthening;Right;10 reps;Seated  PT - End of Session  Equipment Utilized During Treatment Gait belt  Activity Tolerance Patient limited by fatigue  Patient left in chair;with call bell/phone within reach;with family/visitor present  PT - Assessment/Plan  Comments on Treatment Session Pt  making steady progress with PT goals.  Mobility seems to be limited more by fatigue/decreased activity tolerance rather than pain.    PT Plan Discharge plan remains appropriate  PT Frequency 7X/week  Follow Up Recommendations Home health PT;Supervision/Assistance - 24 hour  Equipment Recommended Tub/shower bench  Acute Rehab PT Goals  PT Goal: Sit to Stand - Progress Progressing toward goal  PT Goal: Ambulate - Progress Progressing toward goal  PT Goal: Perform Home Exercise Program - Progress Progressing toward goal      Yvonne Vargas, [place] [phone] 12/05/2011

## 2011-12-05 NOTE — Evaluation (Signed)
Occupational Therapy Evaluation Patient Details Name: Yvonne Vargas MRN: [id number] DOB: 05-11-1941 Today's Date: 12/05/2011 Time: 4098-1191 OT Time Calculation (min): 23 min  OT Assessment / Plan / Recommendation Clinical Impression  Pt. presents s/p Rt. TKA and with increased pain. Pt. will benefit from skilled OT to increase functional independence with ADLs and get pt. to supervision level at D/C.    OT Assessment  Patient needs continued OT Services    Follow Up Recommendations  No OT follow up    Barriers to Discharge None    Equipment Recommendations  Tub/shower bench       Frequency  Min 2X/week    Precautions / Restrictions Precautions Precautions: Knee Restrictions Weight Bearing Restrictions: Yes RLE Weight Bearing: Weight bearing as tolerated       ADL  Eating/Feeding: Simulated;Independent Where Assessed - Eating/Feeding: Chair Grooming: Simulated;Wash/dry hands;Set up;Minimal assistance Where Assessed - Grooming: Standing at sink Upper Body Bathing: Simulated;Set up Where Assessed - Upper Body Bathing: Sitting, chair Lower Body Bathing: Simulated;Maximal assistance Where Assessed - Lower Body Bathing: Sit to stand from chair Upper Body Dressing: Simulated;Set up Where Assessed - Upper Body Dressing: Sitting, chair Lower Body Dressing: Simulated;Maximal assistance Where Assessed - Lower Body Dressing: Sit to stand from chair Toilet Transfer: Simulated;Minimal assistance Toilet Transfer Method: Ambulating Toilet Transfer Equipment: Other (comment) Nurse, children's) Toileting - Clothing Manipulation: Simulated;Minimal assistance Where Assessed - Toileting Clothing Manipulation: Sit to stand from 3-in-1 or toilet Toileting - Hygiene: Simulated;Minimal assistance Where Assessed - Toileting Hygiene: Sit to stand from 3-in-1 or toilet Tub/Shower Transfer: Not assessed Tub/Shower Transfer Method: Not assessed Equipment Used: Rolling walker Ambulation Related to  ADLs: Pt. min assist ~100' with RW and mod verbal cues for sequencing and encouragement ADL Comments: Pt. educated on use of AE for completing LB ADLs due to pt. unable to reach down to rt. LE. Pt. and pt's husband educated on tub seat DME and pt's husband provided with demonstration on the use and technique for transfer. Pt's husband is wanting to go and get seat for tub vs. shower today and if unable to find one will need one ordered from here.     OT Diagnosis: Acute pain  OT Problem List: Decreased activity tolerance;Impaired balance (sitting and/or standing);Decreased knowledge of use of DME or AE;Decreased knowledge of precautions;Pain OT Treatment Interventions: Self-care/ADL training;DME and/or AE instruction;Therapeutic activities;Patient/family education;Balance training   OT Goals Acute Rehab OT Goals OT Goal Formulation: With patient Time For Goal Achievement: 12/12/11 Potential to Achieve Goals: Good ADL Goals Pt Will Perform Lower Body Bathing: with set-up;with supervision;Sit to stand from chair ADL Goal: Lower Body Bathing - Progress: Goal set today Pt Will Perform Lower Body Dressing: with set-up;with supervision;Sit to stand from chair;with adaptive equipment ADL Goal: Lower Body Dressing - Progress: Goal set today Pt Will Transfer to Toilet: with supervision;Ambulation;with DME;3-in-1 ADL Goal: Toilet Transfer - Progress: Goal set today Pt Will Perform Tub/Shower Transfer: Tub transfer;with supervision;with DME;Transfer tub bench ADL Goal: Tub/Shower Transfer - Progress: Goal set today  Visit Information  Last OT Received On: 12/05/11 Assistance Needed: +2 PT/OT Co-Evaluation/Treatment: Yes    Subjective Data  Subjective: "I need to walk"   Prior Functioning  Home Living Lives With: Spouse;Son Available Help at Discharge: Available 24 hours/day Type of Home: House Home Access: Ramped entrance Entrance Stairs-Number of Steps: 14 Home Layout: One level Bathroom  Shower/Tub: Tub/shower unit;Walk-in shower Bathroom Toilet: Standard Bathroom Accessibility: Yes How Accessible: Accessible via walker Home Adaptive Equipment: Bedside commode/3-in-1;Walker -  rolling Prior Function Level of Independence: Independent Able to Take Stairs?: Yes Driving: Yes Vocation: Retired    IT consultant  Overall Cognitive Status: Appears within functional limits for tasks assessed/performed Arousal/Alertness: Awake/alert Orientation Level: Oriented X4 / Intact Behavior During Session: [facility] for tasks performed       Mobility Bed Mobility Bed Mobility: Not assessed Transfers Sit to Stand: 4: Min assist;With upper extremity assist;With armrests;From chair/3-in-1 Stand to Sit: 4: Min assist;With upper extremity assist;With armrests;To chair/3-in-1 Details for Transfer Assistance: Pt. provided assist to complete upright position and for safe hand placement during transitions          End of Session OT - End of Session Equipment Utilized During Treatment: Gait belt Activity Tolerance: Patient tolerated treatment well Patient left: in chair;with call bell/phone within reach Nurse Communication: Mobility status   Yvonne Vargas, OTR/L Pager [phone] 12/05/2011, 12:30 PM

## 2011-12-05 NOTE — Progress Notes (Signed)
UR COMPLETED  

## 2011-12-05 NOTE — Progress Notes (Signed)
CARE MANAGEMENT NOTE 12/05/2011  Patient:  Yvonne Vargas, Yvonne Vargas   Account Number:  [id number]  Date Initiated:  12/05/2011  Documentation initiated by:  Vance Peper  Subjective/Objective Assessment:   71 yr old female s/p right total knee arthroplasty     Action/Plan:   patient preoperatively setup with [facility], no changes. rolling walker, 3in1 and CPM have been delivered by [employer]. Has family support.   Anticipated DC Date:  12/07/2011   Anticipated DC Plan:  HOME W HOME HEALTH SERVICES      DC Planning Services  CM consult      [facility] Choice  HOME HEALTH   Choice offered to / List presented to:  C-1 Patient        HH arranged  HH-2 PT      HH agency  [facility]   Status of service:  Completed, signed off   Discharge Disposition:  HOME W HOME HEALTH SERVICES

## 2011-12-05 NOTE — Progress Notes (Signed)
Georgena Spurling, MD   Altamese Cabal, PA-C [address] [place], [place], [place]  [address]                             [phone]   PROGRESS NOTE  Subjective:  negative for Chest Pain  negative for Shortness of Breath  positive for Nausea/Vomiting   negative for Calf Pain  negative for Bowel Movement   Tolerating Diet: yes         Patient reports pain as 6 on 0-10 scale.    Objective: Vital signs in last 24 hours:   Patient Vitals for the past 24 hrs:  BP Temp Temp src Pulse Resp SpO2  12/05/11 0500 130/85 mmHg 97.7 F (36.5 C) - 89  18  98 %  12/05/11 0215 116/73 mmHg 98.5 F (36.9 C) - 87  18  97 %  12/04/11 2058 134/69 mmHg 98.1 F (36.7 C) - 92  18  98 %  12/04/11 1140 148/86 mmHg 97 F (36.1 C) Oral 106  18  97 %  12/04/11 1123 135/68 mmHg 97.2 F (36.2 C) - 95  18  95 %  12/04/11 1115 - - - 81  12  97 %  12/04/11 1110 141/58 mmHg - - 81  11  100 %  12/04/11 1100 - - - 77  12  100 %  12/04/11 1057 - - - 82  15  100 %  12/04/11 1053 145/76 mmHg - - - - -  12/04/11 1045 - - - 88  18  100 %  12/04/11 1038 134/70 mmHg - - - - -  12/04/11 1033 - - - 82  20  100 %  12/04/11 1030 - - - 92  17  100 %  12/04/11 1023 140/71 mmHg - - - - -  12/04/11 1015 - - - 98  16  100 %  12/04/11 1008 123/62 mmHg - - - - -  12/04/11 1000 - - - 79  21  100 %  12/04/11 0953 132/69 mmHg - - - - -  12/04/11 0945 - - - 83  20  100 %  12/04/11 0938 113/70 mmHg - - - - -  12/04/11 0935 - 96.6 F (35.9 C) - - - -    @flow {1959:LAST@   Intake/Output from previous day:   05/13 0701 - 05/14 0700 In: 2300 [P.O.:200; I.V.:1600] Out: 2625 [Urine:2000; Drains:550]   Intake/Output this shift:       Intake/Output      05/13 0701 - 05/14 0700 05/14 0701 - 05/15 0700   P.O. 200    I.V. 1600    IV Piggyback 500    Total Intake 2300    Urine 2000    Drains 550    Blood 75    Total Output 2625    Net -325            LABORATORY DATA:  Basename 12/05/11 0720  WBC 6.3  HGB 10.8*    HCT 33.8*  PLT 175    Basename 12/05/11 0720  NA 138  K 4.1  CL 102  CO2 27  BUN 11  CREATININE 0.97  GLUCOSE 118*  CALCIUM 9.3   Lab Results  Component Value Date   INR 0.96 11/27/2011    Examination:  General appearance: alert, cooperative and no distress Extremities: Homans sign is negative, no sign of DVT  Wound Exam: clean, dry, intact  Drainage:  None: wound tissue dry  Motor Exam: EHL and FHL Intact  Sensory Exam: Radial and Deep Peroneal normal  Vascular Exam:    Assessment:    1 Day Post-Op  Procedure(s) (LRB): TOTAL KNEE ARTHROPLASTY (Right)  ADDITIONAL DIAGNOSIS:  Active Problems:  * No active hospital problems. *   Acute Blood Loss Anemia   Plan: Physical Therapy as ordered Weight Bearing as Tolerated (WBAT)  DVT Prophylaxis:  Lovenox  DISCHARGE PLAN: Home  DISCHARGE NEEDS: HHPT, CPM, Walker and 3-in-1 comode seat         Aspen Deterding 12/05/2011, 8:55 AM

## 2011-12-06 LAB — BASIC METABOLIC PANEL
BUN: 13 mg/dL (ref 6–23)
CO2: 26 mEq/L (ref 19–32)
Calcium: 9.3 mg/dL (ref 8.4–10.5)
Chloride: 102 mEq/L (ref 96–112)
Creatinine, Ser: 0.97 mg/dL (ref 0.50–1.10)
Glucose, Bld: 123 mg/dL — ABNORMAL HIGH (ref 70–99)

## 2011-12-06 LAB — CBC
HCT: 30.3 % — ABNORMAL LOW (ref 36.0–46.0)
MCH: 30.5 pg (ref 26.0–34.0)
MCV: 94.4 fL (ref 78.0–100.0)
Platelets: 143 10*3/uL — ABNORMAL LOW (ref 150–400)
RBC: 3.21 MIL/uL — ABNORMAL LOW (ref 3.87–5.11)
WBC: 6.2 10*3/uL (ref 4.0–10.5)

## 2011-12-06 MED ORDER — METHOCARBAMOL 500 MG PO TABS: 500.0000 mg | ORAL_TABLET | Freq: Four times a day (QID) | ORAL | Status: AC | PRN

## 2011-12-06 MED ORDER — CELECOXIB 200 MG PO CAPS: 200.0000 mg | ORAL_CAPSULE | Freq: Two times a day (BID) | ORAL | Status: AC

## 2011-12-06 MED ORDER — ENOXAPARIN SODIUM 40 MG/0.4ML ~~LOC~~ SOLN: 40.0000 mg | Freq: Two times a day (BID) | SUBCUTANEOUS | Status: AC

## 2011-12-06 MED ORDER — OXYCODONE HCL 5 MG PO TABS: 5.0000 mg | ORAL_TABLET | ORAL | Status: AC | PRN

## 2011-12-06 MED ORDER — OXYCODONE HCL 10 MG PO TB12: 10.0000 mg | ORAL_TABLET | Freq: Two times a day (BID) | ORAL | Status: AC

## 2011-12-06 NOTE — Discharge Summary (Signed)
PATIENT ID: Yvonne Vargas        MRN:  [id number]          DOB/AGE: Jun 11, 1941 / 71 y.o.    DISCHARGE SUMMARY  ADMISSION DATE:    12/04/2011 DISCHARGE DATE:   12/06/2011   ADMISSION DIAGNOSIS: osteoarthritis right knee    DISCHARGE DIAGNOSIS:  endstage osteoarthritis right knee    ADDITIONAL DIAGNOSIS: Active Problems:  * No active hospital problems. *   Past Medical History  Diagnosis Date  . Hyperlipidemia     takes Simvastatin daily  . Seasonal allergies     takes Loratadine daily  . Arthritis   . Joint pain   . Joint swelling   . GERD (gastroesophageal reflux disease)     rarely but doesn't require meds  . Hemorrhoids   . Diverticulosis   . History of colonic polyps   . Urinary frequency   . Nocturia   . Blood transfusion 1967    after birth of 1st child  . Cataracts, bilateral     immature    PROCEDURE: Procedure(s): TOTAL KNEE ARTHROPLASTY on 12/04/2011  CONSULTS:     HISTORY:  See H&P in chart  HOSPITAL COURSE:  Yvonne Vargas is a 71 y.o. admitted on 12/04/2011 and found to have a diagnosis of endstage osteoarthritis right knee.  After appropriate laboratory studies were obtained  they were taken to the operating room on 12/04/2011 and underwent Procedure(s): TOTAL KNEE ARTHROPLASTY.   They were given perioperative antibiotics:  Anti-infectives     Start     Dose/Rate Route Frequency Ordered Stop   12/04/11 2000   vancomycin (VANCOCIN) IVPB 1000 mg/200 mL premix        1,000 mg 200 mL/hr over 60 Minutes Intravenous Every 12 hours 12/04/11 1222 12/04/11 2052   12/04/11 0000   vancomycin (VANCOCIN) 1,500 mg in sodium chloride 0.9 % 500 mL IVPB  Status:  Discontinued        1,500 mg 250 mL/hr over 120 Minutes Intravenous 60 min pre-op 12/03/11 1505 12/04/11 1222        .  Tolerated the procedure well.  Placed with a foley intraoperatively.  Given Ofirmev at induction and for 48 hours.    POD #1, allowed out of bed to a chair.  PT for ambulation  and exercise program.  Foley D/C'd in morning.  IV saline locked.  O2 discontionued.  POD #2, continued PT and ambulation.   Hemovac pulled. .  The remainder of the hospital course was dedicated to ambulation and strengthening.   The patient was discharged on 2 Days Post-Op in  Good condition.  Blood products given:none  DIAGNOSTIC STUDIES: Recent vital signs: Patient Vitals for the past 24 hrs:  BP Temp Pulse Resp SpO2  12/06/11 1300 93/57 mmHg 97.5 F (36.4 C) 101  18  91 %  12/06/11 0531 108/60 mmHg 101.2 F (38.4 C) 101  18  90 %  Dec 08, 2011 2117 114/68 mmHg 98.3 F (36.8 C) 106  18  96 %       Recent laboratory studies:  [facility] 12/06/11 0545 12-08-11 0720  WBC 6.2 6.3  HGB 9.8* 10.8*  HCT 30.3* 33.8*  PLT 143* 175    Basename 12/06/11 0545 Dec 08, 2011 0720  NA 134* 138  K 4.3 4.1  CL 102 102  CO2 26 27  BUN 13 11  CREATININE 0.97 0.97  GLUCOSE 123* 118*  CALCIUM 9.3 9.3   Lab Results  Component  Value Date   INR 0.96 11/27/2011     Recent Radiographic Studies :  Dg Chest 2 View  11/27/2011  *RADIOLOGY REPORT*  Clinical Data: Preoperative assessment  CHEST - 2 VIEW  Comparison: 07/20/2005  Findings: Upper-normal size of cardiac silhouette. Mediastinal contours and pulmonary vascularity normal. Minimal chronic peribronchial thickening. No acute infiltrate, pleural effusion, or pneumothorax. Bones appear slightly demineralized with minimal scattered endplate spur formation thoracic spine.  IMPRESSION: Minimal chronic bronchitic changes. No acute abnormalities.  Original Report Authenticated By: Lollie Marrow, M.D.    DISCHARGE INSTRUCTIONS: Discharge Orders    Future Orders Please Complete By Expires   Diet - low sodium heart healthy      Call MD / Call 911      Comments:   If you experience chest pain or shortness of breath, CALL 911 and be transported to the hospital emergency room.  If you develope a fever above 101 F, pus (white drainage) or increased drainage or  redness at the wound, or calf pain, call your surgeon's office.   Constipation Prevention      Comments:   Drink plenty of fluids.  Prune juice may be helpful.  You may use a stool softener, such as Colace (over the counter) 100 mg twice a day.  Use MiraLax (over the counter) for constipation as needed.   Increase activity slowly as tolerated      Weight Bearing as taught in Physical Therapy      Comments:   Use a walker or crutches as instructed.   Driving restrictions      Comments:   No driving for 6 weeks   Lifting restrictions      Comments:   No lifting for 6 weeks   CPM      Comments:   Continuous passive motion machine (CPM):      Use the CPM from 0 to 90 for 6-8 hours per day.      You may increase by 10 per day.  You may break it up into 2 or 3 sessions per day.      Use CPM for 2 weeks or until you are told to stop.   TED hose      Comments:   Use stockings (TED hose) for 3 both weeks on both leg(s).  You may remove them at night for sleeping.   Change dressing      Comments:   Change dressing on thursday, then change the dressing daily with sterile 4 x 4 inch gauze dressing and apply TED hose.  You may clean the incision with alcohol prior to redressing.   Do not put a pillow under the knee. Place it under the heel.         DISCHARGE MEDICATIONS:   Medication List  As of 12/06/2011  2:03 PM   STOP taking these medications         aspirin EC 81 MG tablet      GLUCOSAMINE CHONDROITIN PLUS PO         TAKE these medications         CALCIUM PLUS VITAMIN D PO   Take 1 tablet by mouth 2 (two) times daily.      celecoxib 200 MG capsule   Commonly known as: CELEBREX   Take 1 capsule (200 mg total) by mouth 2 (two) times daily.      enoxaparin 40 MG/0.4ML injection   Commonly known as: LOVENOX   Inject 0.4 mLs (40 mg  total) into the skin every 12 (twelve) hours.      loratadine 10 MG tablet   Commonly known as: CLARITIN   Take 10 mg by mouth daily.       methocarbamol 500 MG tablet   Commonly known as: ROBAXIN   Take 1-2 tablets (500-1,000 mg total) by mouth every 6 (six) hours as needed.      oxyCODONE 5 MG immediate release tablet   Commonly known as: Oxy IR/ROXICODONE   Take 1-2 tablets (5-10 mg total) by mouth every 4 (four) hours as needed.      oxyCODONE 10 MG 12 hr tablet   Commonly known as: OXYCONTIN   Take 1 tablet (10 mg total) by mouth every 12 (twelve) hours.      simvastatin 40 MG tablet   Commonly known as: ZOCOR   Take 40 mg by mouth every evening.      Vitamin D3 2000 UNITS capsule   Take 2,000 Units by mouth daily.            FOLLOW UP VISIT:   Follow-up Information    Follow up with Raymon Mutton, MD. Call on 12/19/2011.   Contact information:   [address] [place] [address] [phone]          DISPOSITION:  Home    CONDITION:  Good   Beyla Loney 12/06/2011, 2:03 PM

## 2011-12-06 NOTE — Progress Notes (Signed)
PT Progress Note:     12/06/11 1500  PT Visit Information  Last PT Received On 12/06/11  Assistance Needed +1  PT Time Calculation  PT Start Time 1405  PT Stop Time 1434  PT Time Calculation (min) 29 min  Precautions  Precautions Knee  Restrictions  RLE Weight Bearing WBAT  Cognition  Overall Cognitive Status Appears within functional limits for tasks assessed/performed  Arousal/Alertness Awake/alert  Orientation Level Oriented X4 / Intact  Behavior During Session [facility] for tasks performed  Bed Mobility  Bed Mobility Supine to Sit;Sit to Supine  Supine to Sit 6: Modified independent (Device/Increase time);HOB flat  Sit to Supine 6: Modified independent (Device/Increase time);HOB flat  Transfers  Transfers Sit to Stand;Stand to Sit  Sit to Stand 5: Supervision;With upper extremity assist;From bed  Stand to Sit 5: Supervision;With upper extremity assist;To bed  Details for Transfer Assistance Supervision for safety.  No physical (A) needed.   Ambulation/Gait  Ambulation/Gait Assistance 5: Supervision  Ambulation Distance (Feet) 120 Feet  Assistive device Rolling walker  Ambulation/Gait Assistance Details Cues for increased upright posture.  Pt doing well with progressing to step-through gait pattern for increased fluidity of sequencing.  As pt fatigues she requires increased cueing to stay inside RW  Gait Pattern Step-through pattern;Decreased stance time - right  Stairs Yes  Stairs Assistance 4: Min guard  Stair Management Technique No rails;Forwards;Step to pattern;With walker  Number of Stairs 1  (to mimic stepping up onto ramp)  Exercises  Exercises Total Joint  Total Joint Exercises  Ankle Circles/Pumps AROM;Both;15 reps;Supine  Quad Sets AROM;Both;15 reps;Supine  Long [employer] AROM;Strengthening;Right;15 reps;Seated  Goniometric ROM 75  Knee Flexion AAROM;Right;15 reps;Seated  PT - End of Session  Equipment Utilized During Treatment Gait belt  Activity Tolerance  Patient tolerated treatment well  Patient left in bed;in CPM;with call bell/phone within reach;with family/visitor present  PT - Assessment/Plan  Comments on Treatment Session Pt mobilizing at a level adequate to safely d/c home later today.  Practiced 1 step per pt's request to mimic stepping up onto ramp at home.    PT Plan Discharge plan remains appropriate  PT Frequency 7X/week  Follow Up Recommendations Home health PT;Supervision/Assistance - 24 hour  Equipment Recommended Tub/shower bench  Acute Rehab PT Goals  PT Goal: Supine/Side to Sit - Progress Met  PT Goal: Sit to Supine/Side - Progress Met  PT Goal: Sit to Stand - Progress Progressing toward goal  PT Transfer Goal: Bed to Chair/Chair to Bed - Progress Progressing toward goal  PT Goal: Ambulate - Progress Progressing toward goal  PT Goal: Perform Home Exercise Program - Progress Progressing toward goal      Verdell Face, [place] [phone] 12/06/2011

## 2011-12-06 NOTE — Progress Notes (Signed)
Occupational Therapy Treatment Patient Details Name: Yvonne Vargas MRN: [id number] DOB: 1940/12/15 Today's Date: 12/06/2011 Time: 5621-3086 OT Time Calculation (min): 11 min  OT Assessment / Plan / Recommendation Comments on Treatment Session Pt. progressing very well today.    Follow Up Recommendations  No OT follow up       Equipment Recommendations  Tub/shower bench       Frequency Min 2X/week   Plan Discharge plan remains appropriate    Precautions / Restrictions Precautions Precautions: Knee Restrictions Weight Bearing Restrictions: Yes RLE Weight Bearing: Weight bearing as tolerated   Pertinent Vitals/Pain 4/10 Rt. knee    ADL  Ambulation Related to ADLs: Pt. min guard assist ~50' with RW ADL Comments: Demonstrated for pt. technique for completing shower transfer with posterior entrance and use of RW to complete with inititating transfer with strong leg first and surgical leg when exiting shower. Pt. verbalized recall of technique. pt. will have her husband at home to provide needed assist.      OT Goals Acute Rehab OT Goals OT Goal Formulation: With patient Time For Goal Achievement: 12/12/11 Potential to Achieve Goals: Good ADL Goals Pt Will Transfer to Toilet: with supervision;Ambulation;with DME;3-in-1 ADL Goal: Toilet Transfer - Progress: Progressing toward goals Pt Will Perform Tub/Shower Transfer: Tub transfer;with supervision;with DME;Transfer tub bench ADL Goal: Tub/Shower Transfer - Progress: Progressing toward goals  Visit Information  Last OT Received On: 12/06/11 Assistance Needed: +1          Cognition  Overall Cognitive Status: Appears within functional limits for tasks assessed/performed Arousal/Alertness: Awake/alert Orientation Level: Oriented X4 / Intact Behavior During Session: [facility] for tasks performed    Mobility Bed Mobility Bed Mobility: Not assessed Supine to Sit: 5: Supervision;HOB flat Details for Bed Mobility Assistance:  Supervision for safety & cues to slow down due to pt "slinging" RLE off EOB.  No physical (A) needed.   Transfers Sit to Stand: 4: Min guard;With upper extremity assist;From bed;With armrests;From chair/3-in-1 Stand to Sit: 4: Min guard;With upper extremity assist;With armrests;To chair/3-in-1 Details for Transfer Assistance: Cues for hand placement, RLE positioning.           End of Session OT - End of Session Equipment Utilized During Treatment: Gait belt Activity Tolerance: Patient tolerated treatment well Patient left: in chair;with call bell/phone within reach Nurse Communication: Mobility status CPM Right Knee CPM Right Knee: Off   Dj Senteno, OTR/L Pager [phone] 12/06/2011, 10:54 AM

## 2011-12-06 NOTE — Progress Notes (Signed)
PT Progress Note:     12/06/11 1000  PT Visit Information  Last PT Received On 12/06/11  Assistance Needed +1  PT Time Calculation  PT Start Time 0802  PT Stop Time 0832  PT Time Calculation (min) 30 min  Precautions  Precautions Knee  Restrictions  Weight Bearing Restrictions Yes  RLE Weight Bearing WBAT  Cognition  Overall Cognitive Status Appears within functional limits for tasks assessed/performed  Arousal/Alertness Awake/alert  Orientation Level Oriented X4 / Intact  Behavior During Session [facility] for tasks performed  Bed Mobility  Bed Mobility Supine to Sit  Supine to Sit 5: Supervision;HOB flat  Details for Bed Mobility Assistance Supervision for safety & cues to slow down due to pt "slinging" RLE off EOB.  No physical (A) needed.    Transfers  Transfers Sit to Stand;Stand to Sit  Sit to Stand 4: Min guard;With upper extremity assist;From bed;With armrests;From chair/3-in-1  Stand to Sit 4: Min guard;With upper extremity assist;With armrests;To chair/3-in-1  Details for Transfer Assistance Cues for hand placement, RLE positioning.    Ambulation/Gait  Ambulation/Gait Assistance 4: Min guard  Ambulation Distance (Feet) 80 Feet  Assistive device Rolling walker  Ambulation/Gait Assistance Details Pt continues to require strong encouragement to increase distance.  Fatigues quickly UE's > LE's.  Cues to decrease reliance of UE's on RW & increase WBing through RLE.  Cues for safe manuvering in tight spaces (sidestepping).  Pt required 3 standing rest breaks to complete distance.   Gait Pattern Decreased stance time - right;Decreased step length - left;Step-through pattern;Decreased stride length  Stairs No  Exercises  Exercises Total Joint  Total Joint Exercises  Ankle Circles/Pumps AROM;Both;15 reps;Supine  Quad Sets AROM;Both;15 reps;Supine  Hip ABduction/ADduction AROM;Strengthening;Right;Other reps (comment);Supine (12 reps)  Straight Leg Raises  AROM;Strengthening;Right;Other reps (comment);Supine (12 reps)  PT - End of Session  Equipment Utilized During Treatment Gait belt  Activity Tolerance Patient tolerated treatment well;Patient limited by fatigue  Patient left in chair;Other (comment) (OT present)  PT - Assessment/Plan  Comments on Treatment Session Pt continues to make steady progress with mobilization.    PT Plan Discharge plan remains appropriate  PT Frequency 7X/week  Follow Up Recommendations Home health PT;Supervision/Assistance - 24 hour  Equipment Recommended Tub/shower bench  Acute Rehab PT Goals  PT Goal: Supine/Side to Sit - Progress Progressing toward goal  PT Goal: Sit to Stand - Progress Progressing toward goal  PT Goal: Ambulate - Progress Progressing toward goal  PT Goal: Perform Home Exercise Program - Progress Progressing toward goal      Verdell Face, [place] [phone] 12/06/2011

## 2011-12-06 NOTE — Progress Notes (Signed)
  Georgena Spurling, MD   Altamese Cabal, PA-C [address] [place], [place], [place]  [address]                             [phone]   PROGRESS NOTE  Subjective:  negative for Chest Pain  negative for Shortness of Breath  negative for Nausea/Vomiting   negative for Calf Pain  negative for Bowel Movement   Tolerating Diet: yes         Patient reports pain as 5 on 0-10 scale.    Objective: Vital signs in last 24 hours:   Patient Vitals for the past 24 hrs:  BP Temp Pulse Resp SpO2  12/06/11 1300 93/57 mmHg 97.5 F (36.4 C) 101  18  91 %  12/06/11 0531 108/60 mmHg 101.2 F (38.4 C) 101  18  90 %  12/05/11 2117 114/68 mmHg 98.3 F (36.8 C) 106  18  96 %    @flow {1959:LAST@   Intake/Output from previous day:       Intake/Output this shift:       Intake/Output      05/14 0701 - 05/15 0700 05/15 0701 - 05/16 0700   P.O.     I.V.     IV Piggyback     Total Intake     Urine     Drains     Blood     Total Output     Net          Urine Occurrence 4 x 2 x      LABORATORY DATA:  Basename 12/06/11 0545 12/05/11 0720  WBC 6.2 6.3  HGB 9.8* 10.8*  HCT 30.3* 33.8*  PLT 143* 175    Basename 12/06/11 0545 12/05/11 0720  NA 134* 138  K 4.3 4.1  CL 102 102  CO2 26 27  BUN 13 11  CREATININE 0.97 0.97  GLUCOSE 123* 118*  CALCIUM 9.3 9.3   Lab Results  Component Value Date   INR 0.96 11/27/2011    Examination:  General appearance: alert, cooperative and no distress Extremities: Homans sign is negative, no sign of DVT  Wound Exam: clean, dry, intact   Drainage:  None: wound tissue dry  Motor Exam: EHL and FHL Intact  Sensory Exam: Deep Peroneal normal  Vascular Exam:    Assessment:    2 Days Post-Op  Procedure(s) (LRB): TOTAL KNEE ARTHROPLASTY (Right)  ADDITIONAL DIAGNOSIS:  Active Problems:  * No active hospital problems. *   Acute Blood Loss Anemia   Plan: Physical Therapy as ordered Weight Bearing as Tolerated (WBAT)  DVT Prophylaxis:   Lovenox  DISCHARGE PLAN: Home  DISCHARGE NEEDS: HHPT, CPM, Walker and 3-in-1 comode seat         Adolph Clutter 12/06/2011, 1:56 PM

## 2011-12-06 NOTE — Discharge Instructions (Signed)
Home Health to be provided by [facility] [phone]  Diet: As you were doing prior to hospitalization   Activity:  Increase activity slowly as tolerated                  No lifting or driving for 6 weeks  Shower:  May shower without a dressing once there is no drainage from your wound.                 Do NOT wash over the wound.  If drainage remains, cover wound with saran                  Wrap and then shower.  Clean incision with betadine and change dressing                        After saran wrap removed.  Dressing:  You may change your dressing on Thursday                    Then change the dressing daily with sterile 4"x4"s gauze dressing                     And TED hose for knees.  Use paper tape to hold dressing in place                     For hips.  You may clean the incision with alcohol prior to redressing.  Weight Bearing:  Weight bearing as tolerated as taught in physical therapy.  Use a                                walker or Crutches as instructed.  To prevent constipation: you may use a stool softener such as -               Colace ( over the counter) 100 mg by mouth twice a day                Drink plenty of fluids ( prune juice may be helpful) and high fiber foods                Miralax ( over the counter) for constipation as needed.    Precautions:  If you experience chest pain or shortness of breath - call 911 immediately               For transfer to the hospital emergency department!!               If you develop a fever greater that 101 F, purulent drainage from wound,                             increased redness or drainage from wound, or calf pain -- Call the office at                                                 [phone].  Follow- Up Appointment:  Please call for an appointment to be seen on 12/19/11  [place] - [phone]                   

## 2011-12-10 NOTE — Op Note (Signed)
TOTAL KNEE REPLACEMENT OPERATIVE NOTE:  12/04/2011  10:25 PM  PATIENT:  Yvonne Vargas  71 y.o. female  PRE-OPERATIVE DIAGNOSIS:  endstage osteoarthritis right knee  POST-OPERATIVE DIAGNOSIS:  endstage osteoarthritis right knee  PROCEDURE:  Procedure(s): TOTAL KNEE ARTHROPLASTY  SURGEON:  Surgeon(s): Raymon Mutton, MD  PHYSICIAN ASSISTANT: Altamese Cabal, [facility]  ANESTHESIA:   general  DRAINS: Hemovac and On-Q Marcaine Pain Pump  SPECIMEN: None  COUNTS:  Correct  TOURNIQUET:   Total Tourniquet Time Documented: Thigh (Right) - 48 minutes  DICTATION:  Indication for procedure:    The patient is a 71 y.o. female who has failed conservative treatment for endstage osteoarthritis right knee.  Informed consent was obtained prior to anesthesia. The risks versus benefits of the operation were explain and in a way the patient can, and did, understand.   Description of procedure:     The patient was taken to the operating room and placed under anesthesia.  The patient was positioned in the usual fashion taking care that all body parts were adequately padded and/or protected.  I foley catheter was placed.  A tourniquet was applied and the leg prepped and draped in the usual sterile fashion.  The extremity was exsanguinated with the esmarch and tourniquet inflated to 350 mmHg.  Pre-operative range of motion was normal.  The knee was in 5 degree of mild varus.  A midline incision approximately 6-7 inches long was made with a #10 blade.  A new blade was used to make a parapatellar arthrotomy going 2-3 cm into the quadriceps tendon, over the patella, and alongside the medial aspect of the patellar tendon.  A synovectomy was then performed with the #10 blade and forceps. I then elevated the deep MCL off the medial tibial metaphysis subperiosteally around to the semimembranosus attachment.    I everted the patella and used calipers to measure patellar thickness.  I used the reamer to ream down  to appropriate thickness to recreate the native thickness.  I then removed excess bone with the rongeur and sagittal saw.  I used the appropriately sized template and drilled the three lug holes.  I then put the trial in place and measured the thickness with the calipers to ensure recreation of the native thickness.  The trial was then removed and the patella subluxed and the knee brought into flexion.  A homan retractor was place to retract and protect the patella and lateral structures.  A Z-retractor was place medially to protect the medial structures.  The extra-medullary alignment system was used to make cut the tibial articular surface perpendicular to the anamotic axis of the tibia and in 3 degrees of posterior slope.  The cut surface and alignment jig was removed.  I then used the intramedullary alignment guide to make a 6 valgus cut on the distal femur.  I then marked out the epicondylar axis on the distal femur.  The posterior condylar axis measured 3 degrees.  I then used the anterior referencing sizer and measured the femur to be a size E.  The 4-In-1 cutting block was screwed into place in external rotation matching the posterior condylar angle, making our cuts perpendicular to the epicondylar axis.  Anterior, posterior and chamfer cuts were made with the sagittal saw.  The cutting block and cut pieces were removed.  A lamina spreader was placed in 90 degrees of flexion.  The ACL, PCL, menisci, and posterior condylar osteophytes were removed.  A 12 mm spacer blocked was found to  offer good flexion and extension gap balance after minimal in degree releasing.   The scoop retractor was then placed and the femoral finishing block was pinned in place.  The small sagittal saw was used as well as the lug drill to finish the femur.  The block and cut surfaces were removed and the medullary canal hole filled with autograft bone from the cut pieces.  The tibia was delivered forward in deep flexion and  external rotation.  A size 4 tray was selected and pinned into place centered on the medial 1/3 of the tibial tubercle.  The reamer and keel was used to prepare the tibia through the tray.    I then trialed with the size E femur, size 4 tibia, a 12 mm insert and the 32 patella.  I had excellent flexion/extension gap balance, excellent patella tracking.  Flexion was full and beyond 120 degrees; extension was zero.  These components were chosen and the staff opened them to me on the back table while the knee was lavaged copiously and the cement mixed.  I cemented in the components and removed all excess cement.  The polyethylene tibial component was snapped into place and the knee placed in extension while cement was hardening.  The capsule was infilltrated with 20cc of .25% Marcaine with epinephrine.  A hemovac was place in the joint exiting superolaterally.  A pain pump was place superomedially superficial to the arthrotomy.  Once the cement was hard, the tourniquet was let down.  Hemostasis was obtained.  The arthrotomy was closed with figure-8 #1 vicryl sutures.  The deep soft tissues were closed with #0 vicryls and the subcuticular layer closed with a running #2-0 vicryl.  The skin was reapproximated and closed with skin staples.  The wound was dressed with xeroform, 4 x4's, 2 ABD sponges, a single layer of webril and a TED stocking.   The patient was then awakened, extubated, and taken to the recovery room in stable condition.  BLOOD LOSS:  300cc DRAINS: 1 hemovac, 1 pain catheter COMPLICATIONS:  None.  PLAN OF CARE: Admit to inpatient   PATIENT DISPOSITION:  PACU - hemodynamically stable.   Delay start of Pharmacological VTE agent (>24hrs) due to surgical blood loss or risk of bleeding:  not applicable  Please fax a copy of this op note to my office at [phone] (please only include page 1 and 2 of the Case Information op note)

## 2012-11-23 IMAGING — CR DG CHEST 2V
2 series · 2 of 2 positions shown · non-contrast
Comparison: 07/20/2005

CLINICAL DATA: Preoperative assessment

CHEST - 2 VIEW

[view not recorded (1 of 2)]
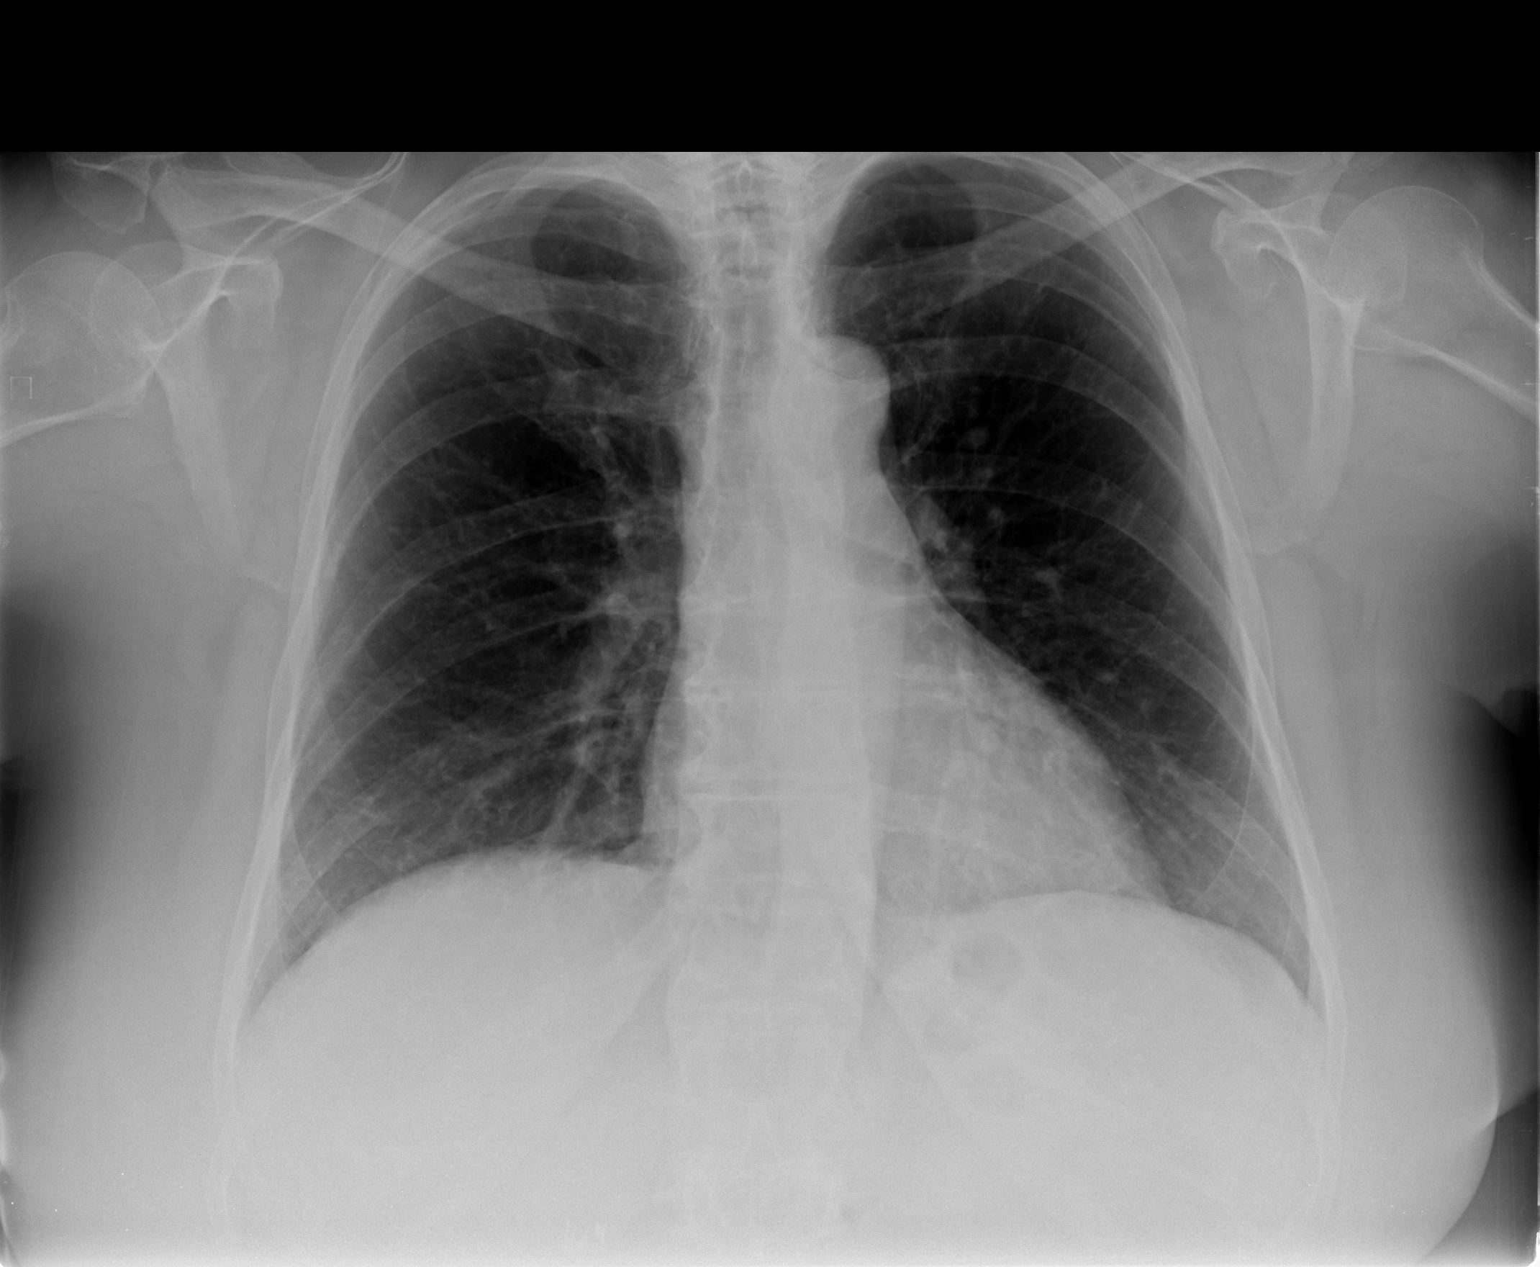

[view not recorded (2 of 2)]
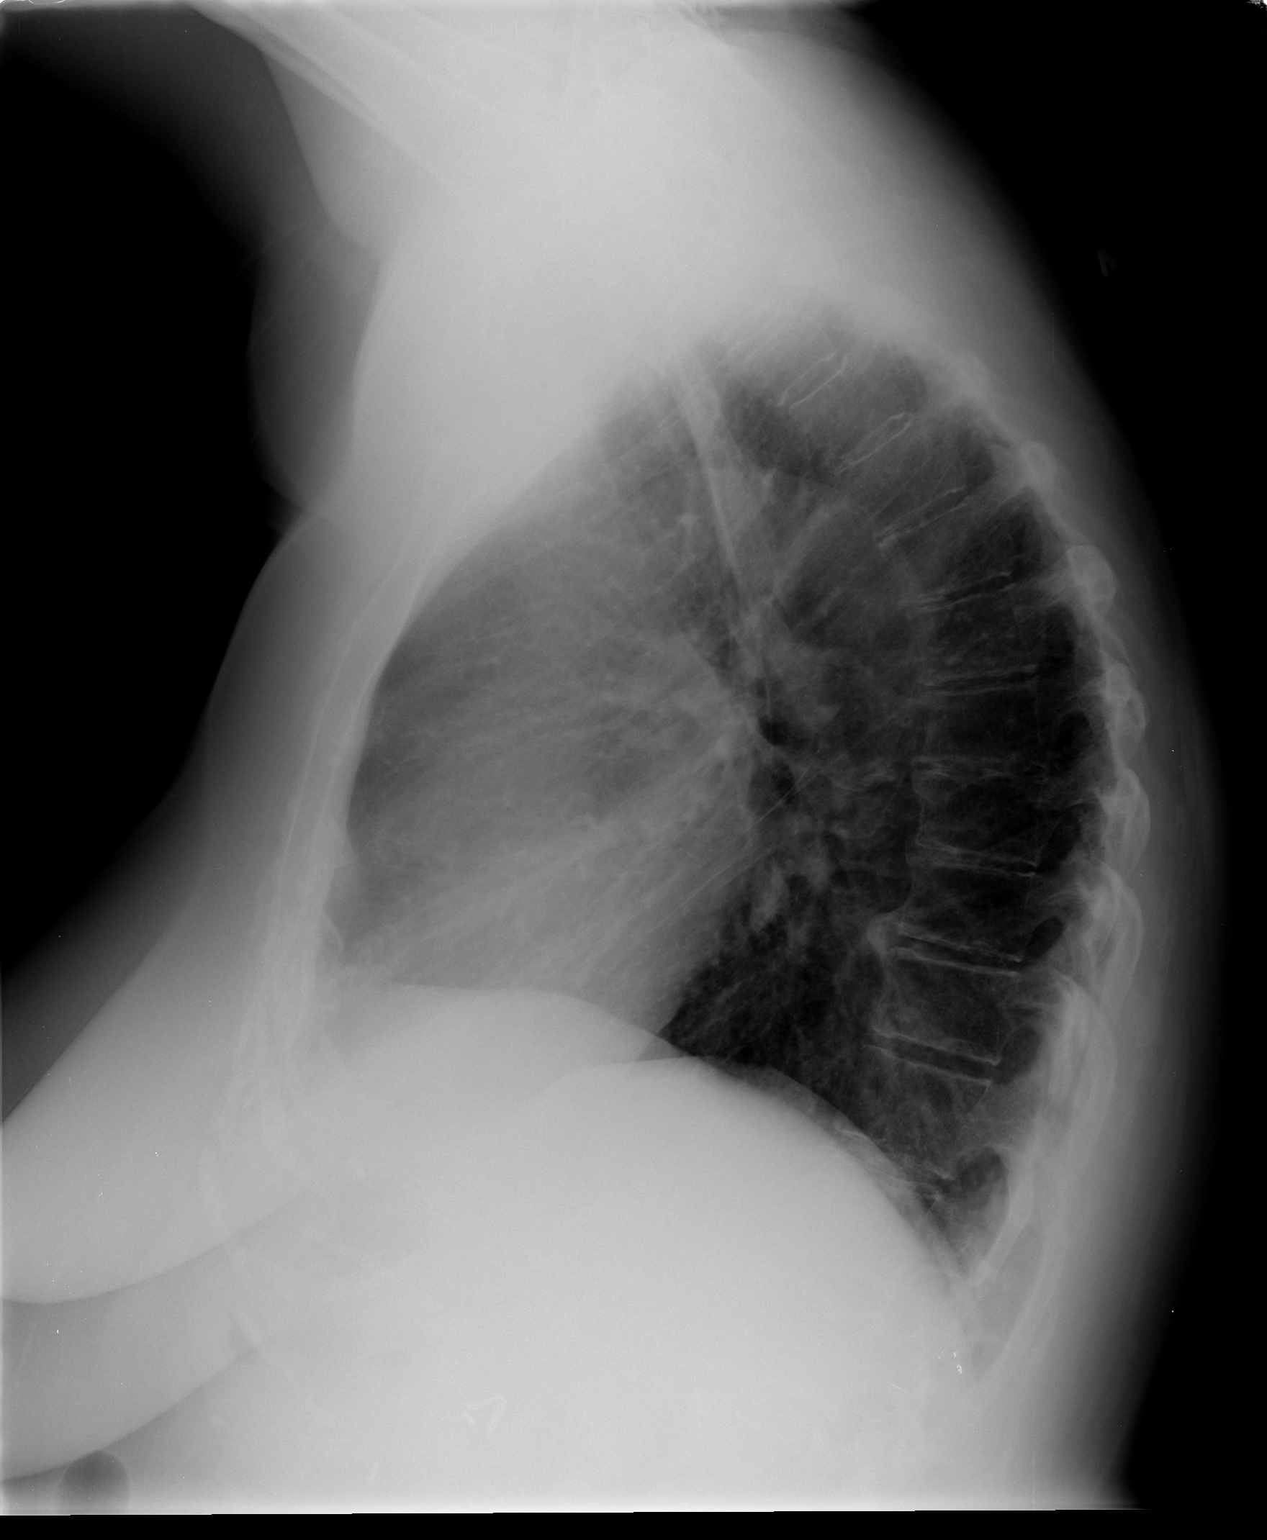

[2 of 2 positions shown; findings below may reference images not displayed]

FINDINGS: Upper-normal size of cardiac silhouette.
Mediastinal contours and pulmonary vascularity normal.
Minimal chronic peribronchial thickening.
No acute infiltrate, pleural effusion, or pneumothorax.
Bones appear slightly demineralized with minimal scattered endplate
spur formation thoracic spine.
IMPRESSION: Minimal chronic bronchitic changes.
No acute abnormalities.

## 2014-09-09 ENCOUNTER — Telehealth: Payer: Self-pay | Admitting: *Deleted

## 2014-09-09 NOTE — Telephone Encounter (Signed)
[facility] back [facility]

## 2014-11-03 ENCOUNTER — Other Ambulatory Visit: Payer: Self-pay | Admitting: Obstetrics & Gynecology

## 2014-11-04 LAB — CYTOLOGY - PAP

## 2020-05-10 DIAGNOSIS — H40013 Open angle with borderline findings, low risk, bilateral: Secondary | ICD-10-CM | POA: Diagnosis not present

## 2020-05-10 DIAGNOSIS — H40033 Anatomical narrow angle, bilateral: Secondary | ICD-10-CM | POA: Diagnosis not present

## 2020-07-07 DIAGNOSIS — M159 Polyosteoarthritis, unspecified: Secondary | ICD-10-CM | POA: Diagnosis not present

## 2020-07-07 DIAGNOSIS — E7800 Pure hypercholesterolemia, unspecified: Secondary | ICD-10-CM | POA: Diagnosis not present

## 2020-07-07 DIAGNOSIS — E559 Vitamin D deficiency, unspecified: Secondary | ICD-10-CM | POA: Diagnosis not present

## 2020-07-07 DIAGNOSIS — R7303 Prediabetes: Secondary | ICD-10-CM | POA: Diagnosis not present

## 2020-11-15 DIAGNOSIS — Z1231 Encounter for screening mammogram for malignant neoplasm of breast: Secondary | ICD-10-CM | POA: Diagnosis not present

## 2020-12-16 DIAGNOSIS — H2513 Age-related nuclear cataract, bilateral: Secondary | ICD-10-CM | POA: Diagnosis not present

## 2020-12-16 DIAGNOSIS — R7309 Other abnormal glucose: Secondary | ICD-10-CM | POA: Diagnosis not present

## 2020-12-16 DIAGNOSIS — H40013 Open angle with borderline findings, low risk, bilateral: Secondary | ICD-10-CM | POA: Diagnosis not present

## 2020-12-16 DIAGNOSIS — H40033 Anatomical narrow angle, bilateral: Secondary | ICD-10-CM | POA: Diagnosis not present

## 2021-01-05 DIAGNOSIS — Z0000 Encounter for general adult medical examination without abnormal findings: Secondary | ICD-10-CM | POA: Diagnosis not present

## 2021-01-05 DIAGNOSIS — Z23 Encounter for immunization: Secondary | ICD-10-CM | POA: Diagnosis not present

## 2021-01-05 DIAGNOSIS — E559 Vitamin D deficiency, unspecified: Secondary | ICD-10-CM | POA: Diagnosis not present

## 2021-01-05 DIAGNOSIS — R7309 Other abnormal glucose: Secondary | ICD-10-CM | POA: Diagnosis not present

## 2021-01-05 DIAGNOSIS — E7800 Pure hypercholesterolemia, unspecified: Secondary | ICD-10-CM | POA: Diagnosis not present

## 2021-07-08 DIAGNOSIS — R7303 Prediabetes: Secondary | ICD-10-CM | POA: Diagnosis not present

## 2021-07-08 DIAGNOSIS — E7800 Pure hypercholesterolemia, unspecified: Secondary | ICD-10-CM | POA: Diagnosis not present

## 2021-08-03 DIAGNOSIS — H40013 Open angle with borderline findings, low risk, bilateral: Secondary | ICD-10-CM | POA: Diagnosis not present

## 2021-08-03 DIAGNOSIS — H25813 Combined forms of age-related cataract, bilateral: Secondary | ICD-10-CM | POA: Diagnosis not present

## 2021-08-03 DIAGNOSIS — H40033 Anatomical narrow angle, bilateral: Secondary | ICD-10-CM | POA: Diagnosis not present

## 2021-11-18 DIAGNOSIS — Z1231 Encounter for screening mammogram for malignant neoplasm of breast: Secondary | ICD-10-CM | POA: Diagnosis not present

## 2021-11-22 DIAGNOSIS — N6311 Unspecified lump in the right breast, upper outer quadrant: Secondary | ICD-10-CM | POA: Diagnosis not present

## 2021-11-30 ENCOUNTER — Other Ambulatory Visit: Payer: Self-pay | Admitting: Radiology

## 2021-11-30 DIAGNOSIS — N6311 Unspecified lump in the right breast, upper outer quadrant: Secondary | ICD-10-CM | POA: Diagnosis not present

## 2022-02-14 DIAGNOSIS — R7303 Prediabetes: Secondary | ICD-10-CM | POA: Diagnosis not present

## 2022-02-14 DIAGNOSIS — M8580 Other specified disorders of bone density and structure, unspecified site: Secondary | ICD-10-CM | POA: Diagnosis not present

## 2022-02-14 DIAGNOSIS — Z0000 Encounter for general adult medical examination without abnormal findings: Secondary | ICD-10-CM | POA: Diagnosis not present

## 2022-02-14 DIAGNOSIS — E559 Vitamin D deficiency, unspecified: Secondary | ICD-10-CM | POA: Diagnosis not present

## 2022-02-14 DIAGNOSIS — E7800 Pure hypercholesterolemia, unspecified: Secondary | ICD-10-CM | POA: Diagnosis not present

## 2022-02-14 DIAGNOSIS — M1990 Unspecified osteoarthritis, unspecified site: Secondary | ICD-10-CM | POA: Diagnosis not present

## 2022-02-24 DIAGNOSIS — M810 Age-related osteoporosis without current pathological fracture: Secondary | ICD-10-CM | POA: Diagnosis not present

## 2022-02-24 DIAGNOSIS — M85851 Other specified disorders of bone density and structure, right thigh: Secondary | ICD-10-CM | POA: Diagnosis not present

## 2022-06-19 DIAGNOSIS — R92321 Mammographic fibroglandular density, right breast: Secondary | ICD-10-CM | POA: Diagnosis not present

## 2022-06-21 DIAGNOSIS — H524 Presbyopia: Secondary | ICD-10-CM | POA: Diagnosis not present

## 2022-06-21 DIAGNOSIS — H04123 Dry eye syndrome of bilateral lacrimal glands: Secondary | ICD-10-CM | POA: Diagnosis not present

## 2022-06-21 DIAGNOSIS — H25813 Combined forms of age-related cataract, bilateral: Secondary | ICD-10-CM | POA: Diagnosis not present

## 2022-06-21 DIAGNOSIS — H40033 Anatomical narrow angle, bilateral: Secondary | ICD-10-CM | POA: Diagnosis not present

## 2022-06-21 DIAGNOSIS — H40013 Open angle with borderline findings, low risk, bilateral: Secondary | ICD-10-CM | POA: Diagnosis not present

## 2022-08-14 DIAGNOSIS — U071 COVID-19: Secondary | ICD-10-CM | POA: Diagnosis not present

## 2022-08-25 DIAGNOSIS — M1990 Unspecified osteoarthritis, unspecified site: Secondary | ICD-10-CM | POA: Diagnosis not present

## 2022-08-25 DIAGNOSIS — E7800 Pure hypercholesterolemia, unspecified: Secondary | ICD-10-CM | POA: Diagnosis not present

## 2022-08-25 DIAGNOSIS — R7303 Prediabetes: Secondary | ICD-10-CM | POA: Diagnosis not present

## 2022-12-01 DIAGNOSIS — Z1231 Encounter for screening mammogram for malignant neoplasm of breast: Secondary | ICD-10-CM | POA: Diagnosis not present

## 2022-12-05 ENCOUNTER — Ambulatory Visit
Admission: RE | Admit: 2022-12-05 | Discharge: 2022-12-05 | Disposition: A | Discharge status: Home / Self Care | Payer: Medicare PPO | Source: Ambulatory Visit | Attending: Nurse Practitioner | Admitting: Nurse Practitioner

## 2022-12-05 ENCOUNTER — Other Ambulatory Visit: Payer: Self-pay

## 2022-12-05 VITALS — BP 164/84 | HR 73 | Temp 98.7°F | Resp 18

## 2022-12-05 DIAGNOSIS — R058 Other specified cough: Secondary | ICD-10-CM | POA: Diagnosis not present

## 2022-12-05 MED ORDER — BENZONATATE 100 MG PO CAPS: 100.0000 mg | ORAL_CAPSULE | Freq: Three times a day (TID) | ORAL | 0 refills | Status: AC

## 2022-12-05 MED ORDER — ALBUTEROL SULFATE HFA 108 (90 BASE) MCG/ACT IN AERS: 1.0000 | INHALATION_SPRAY | Freq: Four times a day (QID) | RESPIRATORY_TRACT | 0 refills | Status: AC | PRN

## 2022-12-05 NOTE — ED Triage Notes (Signed)
Pt presents to UC w/ c/o shortness of breath since using new hairspray x4 days.

## 2022-12-05 NOTE — Discharge Instructions (Signed)
Albuterol inhaler as needed Tessalon as needed for cough Follow-up with your PCP if your symptoms do not improve Please go to the ER if you develop any worsening symptoms

## 2022-12-05 NOTE — ED Provider Notes (Signed)
[facility]    CSN: [id number] Arrival date & time: 12/05/22  1630      History   Chief Complaint Chief Complaint  Patient presents with   Shortness of Breath    HPI Yvonne Vargas is a 82 y.o. female presents for evaluation of a cough.  Patient states she is a new hairspray about 4 days ago and got a "big with" of it.  Since then she has been having irritative cough and feels like she cannot take a deep breath.  She denies fevers, sore throat, congestion, ear pain, body aches, shortness of breath.  Denies history of asthma or smoking.  States she has similar situation several years ago when she had her kitchen remodeled and inhaled some dust.  States symptoms resolved after using an albuterol inhaler.  She has been taking Mucinex for her cough.  No other concerns at this time.   Shortness of Breath Associated symptoms: cough     Past Medical History:  Diagnosis Date   Arthritis    Blood transfusion 1967   after birth of 1st child   Cataracts, bilateral    immature   Diverticulosis    GERD (gastroesophageal reflux disease)    rarely but doesn't require meds   Hemorrhoids    History of colonic polyps    Hyperlipidemia    takes Simvastatin daily   Joint pain    Joint swelling    Nocturia    Seasonal allergies    takes Loratadine daily   Urinary frequency     There are no problems to display for this patient.   Past Surgical History:  Procedure Laterality Date   anal polpys  2010   done in office   BREAST SURGERY  1975   right febrile tumor non malignant   CHOLECYSTECTOMY  2009   COLONOSCOPY     TONSILLECTOMY  1973   TOTAL KNEE ARTHROPLASTY  12/04/2011   Procedure: TOTAL KNEE ARTHROPLASTY;  Surgeon: Raymon Mutton, MD;  Location: [facility] OR;  Service: Orthopedics;  Laterality: Right;  right total knee arthroplasty    OB History   No obstetric history on file.      Home Medications    Prior to Admission medications   Medication Sig Start Date End  Date Taking? Authorizing Provider  albuterol (VENTOLIN HFA) 108 (90 Base) MCG/ACT inhaler Inhale 1-2 puffs into the lungs every 6 (six) hours as needed for wheezing or shortness of breath. 12/05/22  Yes Radford Pax, NP  benzonatate (TESSALON) 100 MG capsule Take 1 capsule (100 mg total) by mouth every 8 (eight) hours. 12/05/22  Yes Radford Pax, NP  Calcium Carbonate-Vitamin D (CALCIUM PLUS VITAMIN D PO) Take 1 tablet by mouth 2 (two) times daily.    [provider]  Cholecalciferol (VITAMIN D3) 2000 UNITS capsule Take 2,000 Units by mouth daily.    [provider]  enoxaparin (LOVENOX) 40 MG/0.4ML injection Inject 0.4 mLs (40 mg total) into the skin every 12 (twelve) hours. 12/06/11   Altamese Cabal, PA-C  loratadine (CLARITIN) 10 MG tablet Take 10 mg by mouth daily.    [provider]  oxyCODONE (OXYCONTIN) 10 MG 12 hr tablet Take 1 tablet (10 mg total) by mouth every 12 (twelve) hours. 12/06/11   Altamese Cabal, PA-C  simvastatin (ZOCOR) 40 MG tablet Take 40 mg by mouth every evening.    [provider]    Family History Family History  Problem Relation Age of Onset  Anesthesia problems Neg Hx    Hypotension Neg Hx    Malignant hyperthermia Neg Hx    Pseudochol deficiency Neg Hx     Social History Social History   Tobacco Use   Smoking status: Never  Substance Use Topics   Alcohol use: No   Drug use: No     Allergies   Clindamycin/lincomycin, Codeine, and Penicillins   Review of Systems Review of Systems  Respiratory:  Positive for cough.        Difficulty taking a deep breath     Physical Exam Triage Vital Signs ED Triage Vitals  Enc Vitals Group     BP 12/05/22 1641 (!) 164/84     Pulse Rate 12/05/22 1641 73     Resp 12/05/22 1641 18     Temp 12/05/22 1641 98.7 F (37.1 C)     Temp Source 12/05/22 1641 Oral     SpO2 12/05/22 1641 94 %     Weight --      Height --      Head Circumference --      Peak Flow --      Pain  Score 12/05/22 1648 0     Pain Loc --      Pain Edu? --      Excl. in GC? --    No data found.  Updated Vital Signs BP (!) 164/84 (BP Location: Left Arm)   Pulse 73   Temp 98.7 F (37.1 C) (Oral)   Resp 18   SpO2 94%   Visual Acuity Right Eye Distance:   Left Eye Distance:   Bilateral Distance:    Right Eye Near:   Left Eye Near:    Bilateral Near:     Physical Exam Vitals and nursing note reviewed.  Constitutional:      General: She is not in acute distress.    Appearance: She is well-developed. She is not ill-appearing.  HENT:     Head: Normocephalic and atraumatic.     Right Ear: Tympanic membrane and ear canal normal.     Left Ear: Tympanic membrane and ear canal normal.     Nose: No congestion.     Mouth/Throat:     Mouth: Mucous membranes are moist.     Pharynx: Oropharynx is clear. Uvula midline. No posterior oropharyngeal erythema.     Tonsils: No tonsillar exudate or tonsillar abscesses.  Eyes:     Conjunctiva/sclera: Conjunctivae normal.     Pupils: Pupils are equal, round, and reactive to light.  Cardiovascular:     Rate and Rhythm: Normal rate and regular rhythm.     Heart sounds: Normal heart sounds.  Pulmonary:     Effort: Pulmonary effort is normal. No respiratory distress.     Breath sounds: Normal breath sounds. No stridor. No wheezing, rhonchi or rales.  Musculoskeletal:     Cervical back: Normal range of motion and neck supple.  Lymphadenopathy:     Cervical: No cervical adenopathy.  Skin:    General: Skin is warm and dry.  Neurological:     General: No focal deficit present.     Mental Status: She is alert and oriented to person, place, and time.  Psychiatric:        Mood and Affect: Mood normal.        Behavior: Behavior normal.      UC Treatments / Results  Labs (all labs ordered are listed, but only abnormal results are displayed) Labs Reviewed - No  data to display  EKG   Radiology No results  found.  Procedures Procedures (including critical care time)  Medications Ordered in UC Medications - No data to display  Initial Impression / Assessment and Plan / UC Course  I have reviewed the triage vital signs and the nursing notes.  Pertinent labs & imaging results that were available during my care of the patient were reviewed by me and considered in my medical decision making (see chart for details).     Reviewed exam and symptoms with patient.  No red flags.  Patient is well-appearing and in no acute distress Albuterol inhaler as needed Tessalon as needed for cough PCP follow-up if symptoms do not improve ER precautions reviewed and patient verbalized understanding Final Clinical Impressions(s) / UC Diagnoses   Final diagnoses:  Allergic cough     Discharge Instructions      Albuterol inhaler as needed Tessalon as needed for cough Follow-up with your PCP if your symptoms do not improve Please go to the ER if you develop any worsening symptoms    ED Prescriptions     Medication Sig Dispense Auth. Provider   benzonatate (TESSALON) 100 MG capsule Take 1 capsule (100 mg total) by mouth every 8 (eight) hours. 21 capsule Radford Pax, NP   albuterol (VENTOLIN HFA) 108 (90 Base) MCG/ACT inhaler Inhale 1-2 puffs into the lungs every 6 (six) hours as needed for wheezing or shortness of breath. 1 each Radford Pax, NP      PDMP not reviewed this encounter.   Radford Pax, NP 12/05/22 1700

## 2022-12-15 DIAGNOSIS — R0689 Other abnormalities of breathing: Secondary | ICD-10-CM | POA: Diagnosis not present

## 2022-12-15 DIAGNOSIS — R058 Other specified cough: Secondary | ICD-10-CM | POA: Diagnosis not present

## 2022-12-29 ENCOUNTER — Other Ambulatory Visit: Payer: Self-pay | Admitting: Family Medicine

## 2022-12-29 ENCOUNTER — Ambulatory Visit
Admission: RE | Admit: 2022-12-29 | Discharge: 2022-12-29 | Disposition: A | Discharge status: Home / Self Care | Payer: Medicare PPO | Source: Ambulatory Visit | Attending: Family Medicine | Admitting: Family Medicine

## 2022-12-29 DIAGNOSIS — R053 Chronic cough: Secondary | ICD-10-CM | POA: Diagnosis not present

## 2022-12-29 DIAGNOSIS — R059 Cough, unspecified: Secondary | ICD-10-CM

## 2023-01-12 DIAGNOSIS — H40033 Anatomical narrow angle, bilateral: Secondary | ICD-10-CM | POA: Diagnosis not present

## 2023-01-12 DIAGNOSIS — H40013 Open angle with borderline findings, low risk, bilateral: Secondary | ICD-10-CM | POA: Diagnosis not present

## 2023-02-20 DIAGNOSIS — Z23 Encounter for immunization: Secondary | ICD-10-CM | POA: Diagnosis not present

## 2023-02-20 DIAGNOSIS — R7303 Prediabetes: Secondary | ICD-10-CM | POA: Diagnosis not present

## 2023-02-20 DIAGNOSIS — Z1211 Encounter for screening for malignant neoplasm of colon: Secondary | ICD-10-CM | POA: Diagnosis not present

## 2023-02-20 DIAGNOSIS — E7800 Pure hypercholesterolemia, unspecified: Secondary | ICD-10-CM | POA: Diagnosis not present

## 2023-02-20 DIAGNOSIS — Z0000 Encounter for general adult medical examination without abnormal findings: Secondary | ICD-10-CM | POA: Diagnosis not present

## 2023-02-20 DIAGNOSIS — E559 Vitamin D deficiency, unspecified: Secondary | ICD-10-CM | POA: Diagnosis not present

## 2023-05-29 DIAGNOSIS — M810 Age-related osteoporosis without current pathological fracture: Secondary | ICD-10-CM | POA: Diagnosis not present

## 2023-05-29 DIAGNOSIS — R42 Dizziness and giddiness: Secondary | ICD-10-CM | POA: Diagnosis not present

## 2023-05-29 DIAGNOSIS — R7303 Prediabetes: Secondary | ICD-10-CM | POA: Diagnosis not present

## 2023-05-31 ENCOUNTER — Ambulatory Visit: Payer: Medicare PPO | Attending: Family Medicine

## 2023-05-31 DIAGNOSIS — R2681 Unsteadiness on feet: Secondary | ICD-10-CM | POA: Diagnosis not present

## 2023-05-31 DIAGNOSIS — R2689 Other abnormalities of gait and mobility: Secondary | ICD-10-CM | POA: Diagnosis not present

## 2023-05-31 DIAGNOSIS — R42 Dizziness and giddiness: Secondary | ICD-10-CM | POA: Insufficient documentation

## 2023-05-31 NOTE — Therapy (Addendum)
OUTPATIENT PHYSICAL THERAPY VESTIBULAR EVALUATION     Patient Name: Yvonne Vargas MRN: [id number] DOB:May 09, 1941, 82 y.o., female Today's Date: 06/01/2023  END OF SESSION:  PT End of Session - 05/31/23 1447     Visit Number 1    Number of Visits 9    Date for PT Re-Evaluation 06/29/23    Authorization Type [employer]    PT Start Time 1445    PT Stop Time 1530    PT Time Calculation (min) 45 min             Past Medical History:  Diagnosis Date   Arthritis    Blood transfusion 1967   after birth of 1st child   Cataracts, bilateral    immature   Diverticulosis    GERD (gastroesophageal reflux disease)    rarely but doesn't require meds   Hemorrhoids    History of colonic polyps    Hyperlipidemia    takes Simvastatin daily   Joint pain    Joint swelling    Nocturia    Seasonal allergies    takes Loratadine daily   Urinary frequency    Past Surgical History:  Procedure Laterality Date   anal polpys  2010   done in office   BREAST SURGERY  1975   right febrile tumor non malignant   CHOLECYSTECTOMY  2009   COLONOSCOPY     TONSILLECTOMY  1973   TOTAL KNEE ARTHROPLASTY  12/04/2011   Procedure: TOTAL KNEE ARTHROPLASTY;  Surgeon: Raymon Mutton, MD;  Location: [facility] OR;  Service: Orthopedics;  Laterality: Right;  right total knee arthroplasty   There are no problems to display for this patient.   PCP: Noberto Retort, MD  REFERRING PROVIDER: Deatra James, MD  REFERRING DIAG: R42 (ICD-10-CM) - Dizziness and giddiness   THERAPY DIAG:  Right Vestibular Hypofunction   ONSET DATE: 6 weeks ago; 04/19/23   Rationale for Evaluation and Treatment: Rehabilitation  SUBJECTIVE:   SUBJECTIVE STATEMENT: Patient presented to clinic with no AD, husband drove her and is waiting in the car. Dizziness started about 6 weeks ago. Reported meclozine makes her tired and did not help with dizziness. Has not thrown up but gets nauseous occasionally. Tried scopolamine  patch for the past 2 weeks and stated it does not help. Quick turns, example of moving around in the kitchen, makes her dizzy and then she feels off the rest of the day. Bumpy roads as a passenger in the car makes her dizzy and makes her feel "out of control ". She reported slight headaches sometimes the last about 30 minutes. Bending over doing laundry seems to set it off as well. She has not driven the car herself since this started, insidious onset, no report of mechanism. She has not had any falls but has grabbed onto furniture or walls occasionally. Demonstrated when she looses her balance it is always towards the right. Does not have dizziness with getting in and out of bed, rolling in bed, or washing her hair in the shower.   Pt accompanied by: self; husband waiting in the car  PERTINENT HISTORY: cataracts, GERD, hyperlipidemia   PAIN:  Are you having pain? No  PRECAUTIONS: Fall  RED FLAGS: None   WEIGHT BEARING RESTRICTIONS: No  FALLS: Has patient fallen in last 6 months? No; reported she grabs onto furniture or walls occasionally   LIVING ENVIRONMENT: Lives with: lives with their spouse Lives in: House/apartment Stairs: Yes: Internal: split entry  steps; can  reach both Has following equipment at home: Single point cane and Walker - 2 wheeled; they are for her husband; she has not felt she has needed to use these   PLOF: Independent with basic ADLs  PATIENT GOALS: "get rid of the dizziness"   OBJECTIVE:  Note: Objective measures were completed at Evaluation unless otherwise noted.  DIAGNOSTIC FINDINGS: non-contributory   COGNITION: Overall cognitive status: Within functional limits for tasks assessed  POSTURE:  rounded shoulders and forward head  Cervical ROM:    WFL   BED MOBILITY:  Independent; no reports of dizziness with bed mobility   GAIT: Gait pattern:  to be assessed further; may benefit from [facility] for safety  Assistive device utilized: None  FUNCTIONAL  TESTS: to be assessed   PATIENT SURVEYS:  FOTO did not capture   VESTIBULAR ASSESSMENT:  GENERAL OBSERVATION: NAD   SYMPTOM BEHAVIOR:  Subjective history: see above   Non-Vestibular symptoms: headaches and nausea/vomiting; nausea but no episodes of vomiting   Type of dizziness: Unsteady with head/body turns and "Swimmyheaded"; always looses balance to the right   Frequency: varies, multiple times per day  Duration: dizziness lasts a minute or two, after affect of feeling off for the rest of the day   Aggravating factors: Induced by motion: turning body quickly, turning head quickly, and sitting in a moving car and Worse in the morning  Relieving factors: no known relieving factors  Progression of symptoms: unchanged; 6 weeks   OCULOMOTOR EXAM:  Ocular Alignment: normal  Ocular ROM: No Limitations  Spontaneous Nystagmus: absent  Gaze-Induced Nystagmus: absent  Smooth Pursuits: intact   Saccades: hypometric/undershoots; undershoots with corrective saccade to the right and up   VESTIBULAR - OCULAR REFLEX:   Slow VOR: Normal  VOR Cancellation: Normal  Head-Impulse Test: HIT Right: positive HIT Left: negative  Dynamic Visual Acuity:  to be assessed as needed   POSITIONAL TESTING: Other: not indicated at this time   MOTION SENSITIVITY: to be assessed   Motion Sensitivity Quotient Intensity: 0 = none, 1 = Lightheaded, 2 = Mild, 3 = Moderate, 4 = Severe, 5 = Vomiting  Intensity  1. Sitting to supine   2. Supine to L side   3. Supine to R side   4. Supine to sitting   5. L Hallpike-Dix   6. Up from L    7. R Hallpike-Dix   8. Up from R    9. Sitting, head tipped to L knee   10. Head up from L knee   11. Sitting, head tipped to R knee   12. Head up from R knee   13. Sitting head turns x5   14.Sitting head nods x5   15. In stance, 180 turn to L    16. In stance, 180 turn to R     VESTIBULAR TREATMENT:                                                                                                     Canalith Repositioning:  Comment: not indicated at this time  Gaze Adaptation:  x1 Viewing Horizontal: Time: 30 sec, 3x/day and x1 Viewing Vertical:  Time: 30 sec, 3x/day  PATIENT EDUCATION: Education details: PT exam findings, initial HEP, POC  Person educated: Patient Education method: Explanation, Demonstration, and Handouts Education comprehension: verbalized understanding and returned demonstration  HOME EXERCISE PROGRAM:   Keeping eyes on target on wall ~3 feet away, tilt head down 15-30 and move head side to side for _30___ seconds. Repeat while moving head up and down for _30___ seconds. Do __3__ sessions per day. Try to start with a plain background. Stop if dizziness increases by 2 points or if eyes are skipping off of target.   GOALS: Goals reviewed with patient? Yes  SHORT TERM GOALS: Target date: same as LTG    LONG TERM GOALS: Target date: 06/29/23  Pt will be independent with HEP for improved gaze stabilization and decreased dizziness.   Baseline: handout provided 11/7 Goal status: INITIAL  2.  MCTSIB goal to be added Baseline: to be assessed  Goal status: INITIAL  3.  MSQ goal to be added  Baseline: to be assessed  Goal status: INITIAL  4.  FOTO goal to be added  Baseline: to be captured  Goal status: INITIAL   ASSESSMENT:  CLINICAL IMPRESSION: Patient is a 82 y.o. female who was seen today for physical therapy evaluation and treatment for dizziness and balance. Positional testing not indicated at this time due to subjective interview. Positive right head impulse test indicative of right vestibular hypofunction. Patient demonstrated HEP in clinic with no increase in symptoms and verbalized wanting to work on it at home to get better faster. PT educated patient on exam findings and difference between vestibular hypofunction and BPPV and tentative POC length. Patient would benefit from skilled physical therapy services to  address the above mentioned findings.   OBJECTIVE IMPAIRMENTS: decreased balance and dizziness.   ACTIVITY LIMITATIONS: bed mobility, locomotion, caring for others, bending down   PARTICIPATION LIMITATIONS: meal prep, cleaning, laundry, and driving  PERSONAL FACTORS: Age, Sex, Transportation, and 3+ comorbidities: see above  are also affecting patient's functional outcome.   REHAB POTENTIAL: Good  CLINICAL DECISION MAKING: Stable/uncomplicated  EVALUATION COMPLEXITY: Low   PLAN:  PT FREQUENCY: 1-2x/week  PT DURATION: 4 weeks  PLANNED INTERVENTIONS: 97164- PT Re-evaluation, 97110-Therapeutic exercises, 97530- Therapeutic activity, [id number]- Neuromuscular re-education, 97535- Self Care, 21308- Manual therapy, [phone]- Gait training, [phone]- Canalith repositioning, [phone]- Aquatic Therapy, Patient/Family education, Balance training, Stair training, Vestibular training, Visual/preceptual remediation/compensation, and DME instructions  PLAN FOR NEXT SESSION: Add to HEP, MCTSIB, MSQ, FOTO, update goals      Yvonne Vargas, SPT  Yvonne Vargas, PT, DPT, CBIS 06/01/2023, 10:07 AM

## 2023-06-01 NOTE — Addendum Note (Signed)
Addended by: Merry Lofty A on: 06/01/2023 11:16 AM   Modules accepted: Orders

## 2023-06-06 ENCOUNTER — Ambulatory Visit: Payer: Medicare PPO

## 2023-06-06 VITALS — BP 116/74 | HR 87

## 2023-06-06 DIAGNOSIS — R2689 Other abnormalities of gait and mobility: Secondary | ICD-10-CM

## 2023-06-06 DIAGNOSIS — R2681 Unsteadiness on feet: Secondary | ICD-10-CM

## 2023-06-06 DIAGNOSIS — R42 Dizziness and giddiness: Secondary | ICD-10-CM | POA: Diagnosis not present

## 2023-06-06 NOTE — Therapy (Signed)
OUTPATIENT PHYSICAL THERAPY VESTIBULAR TREATMENT     Patient Name: Yvonne Vargas MRN: [id number] DOB:1940-09-23, 82 y.o., female Today's Date: 06/06/2023  END OF SESSION:  PT End of Session - 06/06/23 1003     Visit Number 2    Number of Visits 9    Date for PT Re-Evaluation 06/29/23    Authorization Type [employer]    PT Start Time 1017    PT Stop Time 1103    PT Time Calculation (min) 46 min    Equipment Utilized During Treatment Gait belt    Activity Tolerance Patient tolerated treatment well    Behavior During Therapy WFL for tasks assessed/performed             Past Medical History:  Diagnosis Date   Arthritis    Blood transfusion 1967   after birth of 1st child   Cataracts, bilateral    immature   Diverticulosis    GERD (gastroesophageal reflux disease)    rarely but doesn't require meds   Hemorrhoids    History of colonic polyps    Hyperlipidemia    takes Simvastatin daily   Joint pain    Joint swelling    Nocturia    Seasonal allergies    takes Loratadine daily   Urinary frequency    Past Surgical History:  Procedure Laterality Date   anal polpys  2010   done in office   BREAST SURGERY  1975   right febrile tumor non malignant   CHOLECYSTECTOMY  2009   COLONOSCOPY     TONSILLECTOMY  1973   TOTAL KNEE ARTHROPLASTY  12/04/2011   Procedure: TOTAL KNEE ARTHROPLASTY;  Surgeon: Raymon Mutton, MD;  Location: [facility] OR;  Service: Orthopedics;  Laterality: Right;  right total knee arthroplasty   There are no problems to display for this patient.   PCP: Noberto Retort, MD  REFERRING PROVIDER: Deatra James, MD  REFERRING DIAG: R42 (ICD-10-CM) - Dizziness and giddiness   THERAPY DIAG:  Right Vestibular Hypofunction   ONSET DATE: 6 weeks ago; 04/19/23   Rationale for Evaluation and Treatment: Rehabilitation  SUBJECTIVE:   SUBJECTIVE STATEMENT: Patient reports doing fair. Does have worsening of her dizziness to the point where she has not  been attending church or helping her husband. Denies falls. She does report onset of insomnia correlating with onset of dizziness.   Pt accompanied by: self; husband waiting in the car  PERTINENT HISTORY: cataracts, GERD, hyperlipidemia   PAIN:  Are you having pain? No  PRECAUTIONS: Fall  PATIENT GOALS: "get rid of the dizziness"   OBJECTIVE:  Note: Objective measures were completed at Evaluation unless otherwise noted.  DIAGNOSTIC FINDINGS: non-contributory   PATIENT SURVEYS:  FOTO did not capture   VESTIBULAR ASSESSMENT: POSITIONAL TESTING: Right Dix-Hallpike: no nystagmus Left Dix-Hallpike: no nystagmus Right Roll Test: no nystagmus Left Roll Test: no nystagmus  MOTION SENSITIVITY: to be assessed   Motion Sensitivity Quotient Intensity: 0 = none, 1 = Lightheaded, 2 = Mild, 3 = Moderate, 4 = Severe, 5 = Vomiting  Intensity  1. Sitting to supine   2. Supine to L side   3. Supine to R side   4. Supine to sitting   5. L Hallpike-Dix   6. Up from L    7. R Hallpike-Dix   8. Up from R    9. Sitting, head tipped to L knee   10. Head up from L knee   11. Sitting, head tipped to  R knee   12. Head up from R knee   13. Sitting head turns x5   14.Sitting head nods x5   15. In stance, 180 turn to L    16. In stance, 180 turn to R     VESTIBULAR TREATMENT:                                                                                                   -Seated VOR x1 horizontal/vertical   -progressed to standing with increased dizziness onset -Seated VOR x2 horizontal   -added to HEP  PATIENT EDUCATION: Education details: continue HEP, increased water intake, exam findings Person educated: Patient Education method: Explanation, Demonstration, and Handouts Education comprehension: verbalized understanding and returned demonstration  HOME EXERCISE PROGRAM:   Keeping eyes on target on wall ~3 feet away, tilt head down 15-30 and move head side to side for _30___  seconds. Repeat while moving head up and down for _30___ seconds. Do __3__ sessions per day. Try to start with a plain background. Stop if dizziness increases by 2 points or if eyes are skipping off of target.   Access Code: [email or web address] URL: [email or web address] Date: 06/06/2023 Prepared by: Merry Lofty  Exercises - Standing Gaze Stabilization with Two Near Targets and Head Rotation  - 1 x daily - 7 x weekly - 3 sets - 30s hold GOALS: Goals reviewed with patient? Yes  SHORT TERM GOALS: Target date: same as LTG    LONG TERM GOALS: Target date: 06/29/23  Pt will be independent with HEP for improved gaze stabilization and decreased dizziness.   Baseline: handout provided 11/7 Goal status: INITIAL  2.  MCTSIB goal to be added Baseline: to be assessed  Goal status: INITIAL  3.  MSQ goal to be added  Baseline: to be assessed  Goal status: INITIAL  4.  FOTO goal to be added  Baseline: to be captured  Goal status: INITIAL   ASSESSMENT:  CLINICAL IMPRESSION: Patient seen for skilled PT session with emphasis on vestibular exam and retraining. Positional testing remains negative. She does report rather steady state of dizziness with exacerbations with certain movements, including standing up, getting out of bed, etc. Exam continues to be consistent with a vestibular hypofunction, but possibly with an overlay of blood pressure fluctuations with position changes. Continue POC.   OBJECTIVE IMPAIRMENTS: decreased balance and dizziness.   ACTIVITY LIMITATIONS: bed mobility, locomotion, caring for others, bending down   PARTICIPATION LIMITATIONS: meal prep, cleaning, laundry, and driving  PERSONAL FACTORS: Age, Sex, Transportation, and 3+ comorbidities: see above  are also affecting patient's functional outcome.   REHAB POTENTIAL: Good  CLINICAL DECISION MAKING: Stable/uncomplicated  EVALUATION COMPLEXITY: Low   PLAN:  PT FREQUENCY: 1-2x/week  PT DURATION: 4  weeks  PLANNED INTERVENTIONS: 97164- PT Re-evaluation, 97110-Therapeutic exercises, 97530- Therapeutic activity, [id number]- Neuromuscular re-education, 97535- Self Care, 16109- Manual therapy, [id number]- Gait training, [phone]- Canalith repositioning, [phone]- Aquatic Therapy, Patient/Family education, Balance training, Stair training, Vestibular training, Visual/preceptual remediation/compensation, and DME instructions  PLAN FOR NEXT SESSION: Add to HEP, MCTSIB, MSQ, FOTO, update goals  Westley Foots, PT, DPT, CBIS 06/06/2023, 11:31 AM

## 2023-06-11 ENCOUNTER — Ambulatory Visit: Payer: Medicare PPO

## 2023-06-11 DIAGNOSIS — R42 Dizziness and giddiness: Secondary | ICD-10-CM | POA: Diagnosis not present

## 2023-06-11 DIAGNOSIS — R2689 Other abnormalities of gait and mobility: Secondary | ICD-10-CM

## 2023-06-11 DIAGNOSIS — R2681 Unsteadiness on feet: Secondary | ICD-10-CM

## 2023-06-11 NOTE — Therapy (Signed)
OUTPATIENT PHYSICAL THERAPY VESTIBULAR TREATMENT     Patient Name: Yvonne Vargas MRN: [id number] DOB:10-22-1940, 82 y.o., female Today's Date: 06/11/2023  END OF SESSION:  PT End of Session - 06/11/23 1348     Visit Number 3    Number of Visits 9    Date for PT Re-Evaluation 06/29/23    Authorization Type [employer]    PT Start Time 1358    PT Stop Time 1445    PT Time Calculation (min) 47 min    Equipment Utilized During Treatment Gait belt    Activity Tolerance Patient tolerated treatment well    Behavior During Therapy WFL for tasks assessed/performed             Past Medical History:  Diagnosis Date   Arthritis    Blood transfusion 1967   after birth of 1st child   Cataracts, bilateral    immature   Diverticulosis    GERD (gastroesophageal reflux disease)    rarely but doesn't require meds   Hemorrhoids    History of colonic polyps    Hyperlipidemia    takes Simvastatin daily   Joint pain    Joint swelling    Nocturia    Seasonal allergies    takes Loratadine daily   Urinary frequency    Past Surgical History:  Procedure Laterality Date   anal polpys  2010   done in office   BREAST SURGERY  1975   right febrile tumor non malignant   CHOLECYSTECTOMY  2009   COLONOSCOPY     TONSILLECTOMY  1973   TOTAL KNEE ARTHROPLASTY  12/04/2011   Procedure: TOTAL KNEE ARTHROPLASTY;  Surgeon: Raymon Mutton, MD;  Location: [facility] OR;  Service: Orthopedics;  Laterality: Right;  right total knee arthroplasty   There are no problems to display for this patient.   PCP: Noberto Retort, MD  REFERRING PROVIDER: Deatra James, MD  REFERRING DIAG: R42 (ICD-10-CM) - Dizziness and giddiness   THERAPY DIAG:  Right Vestibular Hypofunction   ONSET DATE: 6 weeks ago; 04/19/23   Rationale for Evaluation and Treatment: Rehabilitation  SUBJECTIVE:   SUBJECTIVE STATEMENT: Patient reports doing better. Has started wearing a neck pillow in the car when her husband  drives. Does have a scapolomine patch on today. Slight HA across her forehead today. Denies falls. Has been doing her exercises.   Pt accompanied by: self; husband waiting in the car  PERTINENT HISTORY: cataracts, GERD, hyperlipidemia   PAIN:  Are you having pain? No  PRECAUTIONS: Fall  PATIENT GOALS: "get rid of the dizziness"   OBJECTIVE:  Note: Objective measures were completed at Evaluation unless otherwise noted.  DIAGNOSTIC FINDINGS: non-contributory   PATIENT SURVEYS:  FOTO did not capture   VESTIBULAR ASSESSMENT: MOTION SENSITIVITY: to be assessed   Motion Sensitivity Quotient Intensity: 0 = none, 1 = Lightheaded, 2 = Mild, 3 = Moderate, 4 = Severe, 5 = Vomiting  Intensity  1. Sitting to supine 0  2. Supine to L side 1  3. Supine to R side 0  4. Supine to sitting 2  5. L Hallpike-Dix   6. Up from L    7. R Hallpike-Dix   8. Up from R    9. Sitting, head tipped to L knee 0  10. Head up from L knee 1  11. Sitting, head tipped to R knee 0  12. Head up from R knee 1  13. Sitting head turns x5 1  14.Sitting head  nods x5 0  15. In stance, 180 turn to L  0  16. In stance, 180 turn to R 0  MCTSIB: Condition 1: 30s normal sway  Condition 2: 30s mild sway  Condition 3: 30s moderate sway  Condition 4: 11.81s  VESTIBULAR TREATMENT:                                                                                                   -standing on airex   -WBOS eyes closed 30s   -alternating arm reach with taps to post it notes   -Hart Chart    -increased challenge with NBOS, but able to accommodate  -115' with ball tosses to self visual tracking  -1/2 lap CCW ball circles visual tracking, 1/2 CW ball circles visual tracking  -bending down picking up squigz from pattern floor placing on mirror -trunk twist with feet together on unstable floor mat with pattern rug VOR cancellation eye tracking horizontal   PATIENT EDUCATION: Education details: continue HEP Person  educated: Patient Education method: Programmer, multimedia, Demonstration, and Handouts Education comprehension: verbalized understanding and returned demonstration  HOME EXERCISE PROGRAM:   Keeping eyes on target on wall ~3 feet away, tilt head down 15-30 and move head side to side for _30___ seconds. Repeat while moving head up and down for _30___ seconds. Do __3__ sessions per day. Try to start with a plain background. Stop if dizziness increases by 2 points or if eyes are skipping off of target.   Access Code: [email or web address] URL: [email or web address] Date: 06/06/2023 Prepared by: Merry Lofty  Exercises - Standing Gaze Stabilization with Two Near Targets and Head Rotation  - 1 x daily - 7 x weekly - 3 sets - 30s hold GOALS: Goals reviewed with patient? Yes  SHORT TERM GOALS: Target date: same as LTG    LONG TERM GOALS: Target date: 06/29/23  Pt will be independent with HEP for improved gaze stabilization and decreased dizziness.   Baseline: handout provided 11/7 Goal status: INITIAL  2.  Patient will complete >/= 30s on condition 4 of MCTSIB to demonstrate improved balance.  Baseline: to be assessed  Goal status: REVISED  3.  MSQ goal to be added  Baseline: not indicated based on results Goal status: DISCONTINUED  4.  FOTO goal to be added  Baseline: to be captured  Goal status: INITIAL   ASSESSMENT:  CLINICAL IMPRESSION: Patient seen for skilled PT session with emphasis on vestibular retraining. Overall patient tolerating vestibular challenges much better this session. Does report onset of mild dizziness with EC tasks and horizontal Hart Chart. Describes the dizziness as more of an imbalance rather than spinning. Would continue to benefit from further balance challenges on unsteady surfaces and turns. Continue POC.   OBJECTIVE IMPAIRMENTS: decreased balance and dizziness.   ACTIVITY LIMITATIONS: bed mobility, locomotion, caring for others, bending down    PARTICIPATION LIMITATIONS: meal prep, cleaning, laundry, and driving  PERSONAL FACTORS: Age, Sex, Transportation, and 3+ comorbidities: see above  are also affecting patient's functional outcome.   REHAB POTENTIAL: Good  CLINICAL DECISION MAKING: Stable/uncomplicated  EVALUATION COMPLEXITY: Low  PLAN:  PT FREQUENCY: 1-2x/week  PT DURATION: 4 weeks  PLANNED INTERVENTIONS: 97164- PT Re-evaluation, 97110-Therapeutic exercises, 97530- Therapeutic activity, 97112- Neuromuscular re-education, 97535- Self Care, 40102- Manual therapy, [phone]- Gait training, [phone]- Canalith repositioning, [phone]- Aquatic Therapy, Patient/Family education, Balance training, Stair training, Vestibular training, Visual/preceptual remediation/compensation, and DME instructions  PLAN FOR NEXT SESSION: Add to HEP, turning with blaze pods, unsteady surfaces, EC    Westley Foots, PT, DPT, CBIS 06/11/2023, 3:47 PM

## 2023-06-13 ENCOUNTER — Ambulatory Visit: Payer: Medicare PPO

## 2023-06-13 DIAGNOSIS — R2681 Unsteadiness on feet: Secondary | ICD-10-CM

## 2023-06-13 DIAGNOSIS — R2689 Other abnormalities of gait and mobility: Secondary | ICD-10-CM

## 2023-06-13 DIAGNOSIS — R42 Dizziness and giddiness: Secondary | ICD-10-CM | POA: Diagnosis not present

## 2023-06-13 NOTE — Therapy (Signed)
OUTPATIENT PHYSICAL THERAPY VESTIBULAR TREATMENT     Patient Name: Yvonne Vargas MRN: [id number] DOB:November 06, 1940, 82 y.o., female Today's Date: 06/13/2023  END OF SESSION:  PT End of Session - 06/13/23 1137     Visit Number 4    Number of Visits 9    Date for PT Re-Evaluation 06/29/23    Authorization Type [employer]    PT Start Time 1145    PT Stop Time 1230    PT Time Calculation (min) 45 min    Equipment Utilized During Treatment Gait belt    Activity Tolerance Patient tolerated treatment well    Behavior During Therapy WFL for tasks assessed/performed             Past Medical History:  Diagnosis Date   Arthritis    Blood transfusion 1967   after birth of 1st child   Cataracts, bilateral    immature   Diverticulosis    GERD (gastroesophageal reflux disease)    rarely but doesn't require meds   Hemorrhoids    History of colonic polyps    Hyperlipidemia    takes Simvastatin daily   Joint pain    Joint swelling    Nocturia    Seasonal allergies    takes Loratadine daily   Urinary frequency    Past Surgical History:  Procedure Laterality Date   anal polpys  2010   done in office   BREAST SURGERY  1975   right febrile tumor non malignant   CHOLECYSTECTOMY  2009   COLONOSCOPY     TONSILLECTOMY  1973   TOTAL KNEE ARTHROPLASTY  12/04/2011   Procedure: TOTAL KNEE ARTHROPLASTY;  Surgeon: Raymon Mutton, MD;  Location: [facility] OR;  Service: Orthopedics;  Laterality: Right;  right total knee arthroplasty   There are no problems to display for this patient.   PCP: Noberto Retort, MD  REFERRING PROVIDER: Deatra James, MD  REFERRING DIAG: R42 (ICD-10-CM) - Dizziness and giddiness   THERAPY DIAG:  Right Vestibular Hypofunction   ONSET DATE: 6 weeks ago; 04/19/23   Rationale for Evaluation and Treatment: Rehabilitation  SUBJECTIVE:   SUBJECTIVE STATEMENT: Patient reports doing fair. Has had better days. Has been doing her exercises, but this AM had  to look through cabinets and then started to get dizzy. Currently at a 2.5-3/5 feeling like she's on a boat. Denies falls.   Pt accompanied by: self; husband waiting in the car  PERTINENT HISTORY: cataracts, GERD, hyperlipidemia   PAIN:  Are you having pain? No  PRECAUTIONS: Fall  PATIENT GOALS: "get rid of the dizziness"   OBJECTIVE:  Note: Objective measures were completed at Evaluation unless otherwise noted.  DIAGNOSTIC FINDINGS: non-contributory   VESTIBULAR TREATMENT:                                                                                                   -seated-> standing-> stand on airex eyes/head with popsicle stick -seated-> standing-> standing on airex VOR x2 with card (PT needing to move card for patient) -turning in circles on floor mat to tap blaze  pods with feet  -semi circle turns with 2 blaze pods on mirror as well  PATIENT EDUCATION: Education details: continue HEP, added VOR x2 Person educated: Patient Education method: Explanation, Demonstration, and Handouts Education comprehension: verbalized understanding and returned demonstration  HOME EXERCISE PROGRAM:   Keeping eyes on target on wall ~3 feet away, tilt head down 15-30 and move head side to side for _30___ seconds. Repeat while moving head up and down for _30___ seconds. Do __3__ sessions per day. Try to start with a plain background. Stop if dizziness increases by 2 points or if eyes are skipping off of target.   Access Code: [email or web address] URL: [email or web address] Date: 06/06/2023 Prepared by: Merry Lofty  Exercises - Standing Gaze Stabilization with Two Near Targets and Head Rotation  - 1 x daily - 7 x weekly - 3 sets - 30s hold - Gaze Stability (VOR) x2 with Static March with horizontal head turns  - 1 x daily - 7 x weekly - 3 sets - 30s hold  GOALS: Goals reviewed with patient? Yes  SHORT TERM GOALS: Target date: same as LTG    LONG TERM GOALS: Target date:  06/29/23  Pt will be independent with HEP for improved gaze stabilization and decreased dizziness.   Baseline: handout provided 11/7 Goal status: INITIAL  2.  Patient will complete >/= 30s on condition 4 of MCTSIB to demonstrate improved balance.  Baseline: to be assessed  Goal status: REVISED  3.  MSQ goal to be added  Baseline: not indicated based on results Goal status: DISCONTINUED  4.  FOTO goal to be added  Baseline: to be captured  Goal status: INITIAL   ASSESSMENT:  CLINICAL IMPRESSION: Patient seen for skilled PT session with emphasis on vestibular retraining. Increased baseline dizziness today, but tolerated treatment well. Improving tolerance to turns, especially speed of turns noted. Does not report increased dizziness when turns combined with vertical plane head movement. Continue POC.   OBJECTIVE IMPAIRMENTS: decreased balance and dizziness.   ACTIVITY LIMITATIONS: bed mobility, locomotion, caring for others, bending down   PARTICIPATION LIMITATIONS: meal prep, cleaning, laundry, and driving  PERSONAL FACTORS: Age, Sex, Transportation, and 3+ comorbidities: see above  are also affecting patient's functional outcome.   REHAB POTENTIAL: Good  CLINICAL DECISION MAKING: Stable/uncomplicated  EVALUATION COMPLEXITY: Low   PLAN:  PT FREQUENCY: 1-2x/week  PT DURATION: 4 weeks  PLANNED INTERVENTIONS: 97164- PT Re-evaluation, 97110-Therapeutic exercises, 97530- Therapeutic activity, 97112- Neuromuscular re-education, 97535- Self Care, 82956- Manual therapy, [phone]- Gait training, [phone]- Canalith repositioning, [phone]- Aquatic Therapy, Patient/Family education, Balance training, Stair training, Vestibular training, Visual/preceptual remediation/compensation, and DME instructions  PLAN FOR NEXT SESSION: Add to HEP, turning with blaze pods, unsteady surfaces, EC    Westley Foots, PT, DPT, CBIS 06/13/2023, 12:41 PM

## 2023-06-18 ENCOUNTER — Ambulatory Visit: Payer: Medicare PPO | Admitting: Physical Therapy

## 2023-06-18 ENCOUNTER — Encounter: Payer: Self-pay | Admitting: Physical Therapy

## 2023-06-18 DIAGNOSIS — R42 Dizziness and giddiness: Secondary | ICD-10-CM

## 2023-06-18 DIAGNOSIS — R2681 Unsteadiness on feet: Secondary | ICD-10-CM

## 2023-06-18 DIAGNOSIS — R2689 Other abnormalities of gait and mobility: Secondary | ICD-10-CM | POA: Diagnosis not present

## 2023-06-18 NOTE — Therapy (Signed)
OUTPATIENT PHYSICAL THERAPY VESTIBULAR TREATMENT     Patient Name: Yvonne Vargas MRN: [id number] DOB:Sep 16, 1940, 82 y.o., female Today's Date: 06/18/2023  END OF SESSION:  PT End of Session - 06/18/23 1957     Visit Number 5    Number of Visits 9    Date for PT Re-Evaluation 06/29/23    Authorization Type [employer]    PT Start Time 1104    PT Stop Time 1151    PT Time Calculation (min) 47 min    Activity Tolerance Patient tolerated treatment well    Behavior During Therapy WFL for tasks assessed/performed              Past Medical History:  Diagnosis Date   Arthritis    Blood transfusion 1967   after birth of 1st child   Cataracts, bilateral    immature   Diverticulosis    GERD (gastroesophageal reflux disease)    rarely but doesn't require meds   Hemorrhoids    History of colonic polyps    Hyperlipidemia    takes Simvastatin daily   Joint pain    Joint swelling    Nocturia    Seasonal allergies    takes Loratadine daily   Urinary frequency    Past Surgical History:  Procedure Laterality Date   anal polpys  2010   done in office   BREAST SURGERY  1975   right febrile tumor non malignant   CHOLECYSTECTOMY  2009   COLONOSCOPY     TONSILLECTOMY  1973   TOTAL KNEE ARTHROPLASTY  12/04/2011   Procedure: TOTAL KNEE ARTHROPLASTY;  Surgeon: Raymon Mutton, MD;  Location: [facility] OR;  Service: Orthopedics;  Laterality: Right;  right total knee arthroplasty   There are no problems to display for this patient.   PCP: Noberto Retort, MD  REFERRING PROVIDER: Deatra James, MD  REFERRING DIAG: R42 (ICD-10-CM) - Dizziness and giddiness   THERAPY DIAG:  Right Vestibular Hypofunction   ONSET DATE: 6 weeks ago; 04/19/23   Rationale for Evaluation and Treatment: Rehabilitation  SUBJECTIVE:   SUBJECTIVE STATEMENT: Patient reports she had a great week until yesterday (Sunday)- says she was OK when she got up  - drank a protein drink; got ready for church,  sat thru Sunday school and started getting nauseous and dizzy so she left and went home afterwards, and did not stay for church service.  Pt states she had 5 days without dizziness and felt great.  Pt reports she is a little dizzy right now but not bad - doesn't understand why she has good days and then has a bad day for no known reason.   Pt accompanied by: self; husband waiting in the car  PERTINENT HISTORY: cataracts, GERD, hyperlipidemia   PAIN:  Are you having pain? No  PRECAUTIONS: Fall  PATIENT GOALS: "get rid of the dizziness"   OBJECTIVE:  Note: Objective measures were completed at Evaluation unless otherwise noted.  DIAGNOSTIC FINDINGS: non-contributory   VESTIBULAR TREATMENT:  SVA - line 10:  DVA - line 6;  question accuracy of this test due to pt wearing progressive lenses - pt reported no dizziness upon completion of test  Pt performed x1 viewing in standing - on floor - target on plain wall - 5' away - horizontal head turns for 60 secs - no c/o dizziness;  vertical head turns for 60 secs - in standing; pt reported no dizziness upon completion of this exercise  Pt amb. 40' x 2 reps with horizontal head turns - moderate unsteadiness and c/o dizziness reported on 2nd rep;  40' x 2 reps - amb. With vertical head turns - mild unsteadiness and c/o dizziness upon completion of both reps - possibly cumulative effect after performing amb. With horizontal head turns  Pt stood on Airex - feet apart x 30 secs with EO and then with EC;  stood with feet together with EO 30 secs and then 30 secs with EC with mild postural sway noted - pt touched wall on Rt side x 1 and wall on Lt side x 1 but was able to recover balance independently  Access Code: [email or web address] URL: [email or web address] Date: 06/18/2023 Prepared by: Maebelle Munroe  Exercises - Standing Balance with Eyes OPEN,  then CLOSED on Foam  - 1 x daily - 7 x weekly - 3 sets - 10 reps; and then add head turns with EO and with EC (see instructions in custom notes on HEP)  Progressed x1 viewing exercise to standing and to 60 secs  PATIENT EDUCATION: Education details:  educated pt on vestibular system - being both peripheral and central:  informed pt that her symptoms appeared to be due to vestibular hypofunction - of peripheral etiology, as primary PT had explained to pt.  HEP updated to include balance on foam - [employer] Person educated: Patient Education method: Explanation, Demonstration, and Handouts Education comprehension: verbalized understanding and returned demonstration  HOME EXERCISE PROGRAM:   Keeping eyes on target on wall ~3 feet away, tilt head down 15-30 and move head side to side for _30___ seconds. Repeat while moving head up and down for _30___ seconds. Do __3__ sessions per day. Try to start with a plain background. Stop if dizziness increases by 2 points or if eyes are skipping off of target.   Access Code: [email or web address] URL: [email or web address] Date: 06/06/2023 Prepared by: Merry Lofty  Exercises - Standing Gaze Stabilization with Two Near Targets and Head Rotation  - 1 x daily - 7 x weekly - 3 sets - 30s hold - Gaze Stability (VOR) x2 with Static March with horizontal head turns  - 1 x daily - 7 x weekly - 3 sets - 30s hold  GOALS: Goals reviewed with patient? Yes  SHORT TERM GOALS: Target date: same as LTG    LONG TERM GOALS: Target date: 06/29/23  Pt will be independent with HEP for improved gaze stabilization and decreased dizziness.   Baseline: handout provided 11/7 Goal status: INITIAL  2.  Patient will complete >/= 30s on condition 4 of MCTSIB to demonstrate improved balance.  Baseline: to be assessed  Goal status: REVISED  3.  MSQ goal to be added  Baseline: not indicated based on results Goal status: DISCONTINUED  4.  FOTO goal to be added   Baseline: to be captured  Goal status: INITIAL   ASSESSMENT:  CLINICAL IMPRESSION: PT session focused on vestibular exercises including x1 viewing, ambulating with head turns (pt had mild unsteadiness and c/o dizziness with horizontal and with  vertical head turns with ambulation) and also reported onset of mild headache after amb. With head turns.  Pt's DVA was a 6 line difference from SVA but accuracy of result questioned as pt wearing progressive lenses and also due to pt reporting no increase in dizziness upon completion of test.  Pt reports she had 5 days without any dizziness last week and then had dizziness on Sunday, with unknown etiology with no correlation noted.  Pt appears to have mild decreased vestibular input in maintaining balance.  Continue POC.   OBJECTIVE IMPAIRMENTS: decreased balance and dizziness.   ACTIVITY LIMITATIONS: bed mobility, locomotion, caring for others, bending down   PARTICIPATION LIMITATIONS: meal prep, cleaning, laundry, and driving  PERSONAL FACTORS: Age, Sex, Transportation, and 3+ comorbidities: see above  are also affecting patient's functional outcome.   REHAB POTENTIAL: Good  CLINICAL DECISION MAKING: Stable/uncomplicated  EVALUATION COMPLEXITY: Low   PLAN:  PT FREQUENCY: 1-2x/week  PT DURATION: 4 weeks  PLANNED INTERVENTIONS: 97164- PT Re-evaluation, 97110-Therapeutic exercises, 97530- Therapeutic activity, 97112- Neuromuscular re-education, 97535- Self Care, 21308- Manual therapy, [phone]- Gait training, [phone]- Canalith repositioning, [phone]- Aquatic Therapy, Patient/Family education, Balance training, Stair training, Vestibular training, Visual/preceptual remediation/compensation, and DME instructions  PLAN FOR NEXT SESSION: Check updated HEP for any questions:  turning with blaze pods, unsteady surfaces, EC    Kerry Fort, PT 06/18/2023, 8:00 PM

## 2023-06-20 ENCOUNTER — Ambulatory Visit: Payer: Medicare PPO

## 2023-06-20 DIAGNOSIS — R42 Dizziness and giddiness: Secondary | ICD-10-CM

## 2023-06-20 DIAGNOSIS — R2681 Unsteadiness on feet: Secondary | ICD-10-CM | POA: Diagnosis not present

## 2023-06-20 DIAGNOSIS — R2689 Other abnormalities of gait and mobility: Secondary | ICD-10-CM | POA: Diagnosis not present

## 2023-06-20 NOTE — Therapy (Signed)
OUTPATIENT PHYSICAL THERAPY VESTIBULAR TREATMENT     Patient Name: Yvonne Vargas MRN: [id number] DOB:October 15, 1940, 82 y.o., female Today's Date: 06/20/2023  END OF SESSION:  PT End of Session - 06/20/23 1110     Visit Number 6    Number of Visits 9    Date for PT Re-Evaluation 06/29/23    Authorization Type [employer]    PT Start Time 1109    PT Stop Time 1147    PT Time Calculation (min) 38 min    Equipment Utilized During Treatment Gait belt    Activity Tolerance Patient tolerated treatment well              Past Medical History:  Diagnosis Date   Arthritis    Blood transfusion 1967   after birth of 1st child   Cataracts, bilateral    immature   Diverticulosis    GERD (gastroesophageal reflux disease)    rarely but doesn't require meds   Hemorrhoids    History of colonic polyps    Hyperlipidemia    takes Simvastatin daily   Joint pain    Joint swelling    Nocturia    Seasonal allergies    takes Loratadine daily   Urinary frequency    Past Surgical History:  Procedure Laterality Date   anal polpys  2010   done in office   BREAST SURGERY  1975   right febrile tumor non malignant   CHOLECYSTECTOMY  2009   COLONOSCOPY     TONSILLECTOMY  1973   TOTAL KNEE ARTHROPLASTY  12/04/2011   Procedure: TOTAL KNEE ARTHROPLASTY;  Surgeon: Raymon Mutton, MD;  Location: [facility] OR;  Service: Orthopedics;  Laterality: Right;  right total knee arthroplasty   There are no problems to display for this patient.   PCP: Noberto Retort, MD  REFERRING PROVIDER: Deatra James, MD  REFERRING DIAG: R42 (ICD-10-CM) - Dizziness and giddiness   THERAPY DIAG:  Right Vestibular Hypofunction   ONSET DATE: 6 weeks ago; 04/19/23   Rationale for Evaluation and Treatment: Rehabilitation  SUBJECTIVE:   SUBJECTIVE STATEMENT: Patient reported she has been doing really well and being compliant with HEP. Has been increasing her activity. Still limiting her driving but feels she  could probably drive more.   Pt accompanied by: self; husband waiting in the car  PERTINENT HISTORY: cataracts, GERD, hyperlipidemia   PAIN:  Are you having pain? No  PRECAUTIONS: Fall  PATIENT GOALS: "get rid of the dizziness"   OBJECTIVE:  Note: Objective measures were completed at Evaluation unless otherwise noted.  DIAGNOSTIC FINDINGS: non-contributory   VESTIBULAR TREATMENT:                                                                                                    Busy background: - head nods - head turns  - walking w/ head nods - walking w/ head turns   On Airex: - driving video   Turning w/ ball toss  Access Code: [email or web address] URL: [email or web address] Date: 06/18/2023 Prepared by: Maebelle Munroe  Exercises - Standing Balance with Eyes OPEN,  then CLOSED on Foam  - 1 x daily - 7 x weekly - 3 sets - 10 reps; and then add head turns with EO and with EC (see instructions in custom notes on HEP)  Progressed x1 viewing exercise to standing and to 60 secs  PATIENT EDUCATION: Education details:  educated pt on vestibular system - being both peripheral and central:  informed pt that her symptoms appeared to be due to vestibular hypofunction - of peripheral etiology, as primary PT had explained to pt.  HEP updated to include balance on foam - [employer] Person educated: Patient Education method: Explanation, Demonstration, and Handouts Education comprehension: verbalized understanding and returned demonstration  HOME EXERCISE PROGRAM:   Keeping eyes on target on wall ~3 feet away, tilt head down 15-30 and move head side to side for _30___ seconds. Repeat while moving head up and down for _30___ seconds. Do __3__ sessions per day. Try to start with a plain background. Stop if dizziness increases by 2 points or if eyes are skipping off of target.   Access Code: [email or web address] URL: [email or web address] Date: 06/06/2023 Prepared by: Merry Lofty  Exercises - Standing Gaze Stabilization with Two Near Targets and Head Rotation  - 1 x daily - 7 x weekly - 3 sets - 30s hold - Gaze Stability (VOR) x2 with Static March with horizontal head turns  - 1 x daily - 7 x weekly - 3 sets - 30s hold  GOALS: Goals reviewed with patient? Yes  SHORT TERM GOALS: Target date: same as LTG    LONG TERM GOALS: Target date: 06/29/23  Pt will be independent with HEP for improved gaze stabilization and decreased dizziness.   Baseline: handout provided 11/7 Goal status: INITIAL  2.  Patient will complete >/= 30s on condition 4 of MCTSIB to demonstrate improved balance.  Baseline: to be assessed  Goal status: REVISED  3.  MSQ goal to be added  Baseline: not indicated based on results Goal status: DISCONTINUED  4.  FOTO goal to be added  Baseline: to be captured  Goal status: INITIAL   ASSESSMENT:  CLINICAL IMPRESSION: Patient see today for skilled physical therapy with emphasis on vestibular and balance training. Did well with busy background tasks and had mild swaying when standing on airex pad and watching an optokinetic driving video. Started ball toss with 180 degree turn with small stepping to turn and progressed to one larger step to turn. Quick turns seem to be most challenging and trigger dizziness the most. Progressing well, continue POC.   OBJECTIVE IMPAIRMENTS: decreased balance and dizziness.   ACTIVITY LIMITATIONS: bed mobility, locomotion, caring for others, bending down   PARTICIPATION LIMITATIONS: meal prep, cleaning, laundry, and driving  PERSONAL FACTORS: Age, Sex, Transportation, and 3+ comorbidities: see above  are also affecting patient's functional outcome.   REHAB POTENTIAL: Good  CLINICAL DECISION MAKING: Stable/uncomplicated  EVALUATION COMPLEXITY: Low   PLAN:  PT FREQUENCY: 1-2x/week  PT DURATION: 4 weeks  PLANNED INTERVENTIONS: 97164- PT Re-evaluation, 97110-Therapeutic exercises, 97530-  Therapeutic activity, 97112- Neuromuscular re-education, 97535- Self Care, 52841- Manual therapy, [phone]- Gait training, [phone]- Canalith repositioning, [phone]- Aquatic Therapy, Patient/Family education, Balance training, Stair training, Vestibular training, Visual/preceptual remediation/compensation, and DME instructions  PLAN FOR NEXT SESSION: Check updated HEP for any questions:  turning with blaze pods, unsteady surfaces, EC    Doye Montilla M Francessca Friis, SPT  06/20/2023, 11:47 AM

## 2023-06-25 ENCOUNTER — Ambulatory Visit: Payer: Medicare PPO | Attending: Family Medicine

## 2023-06-25 DIAGNOSIS — R2689 Other abnormalities of gait and mobility: Secondary | ICD-10-CM | POA: Insufficient documentation

## 2023-06-25 DIAGNOSIS — R42 Dizziness and giddiness: Secondary | ICD-10-CM | POA: Insufficient documentation

## 2023-06-25 DIAGNOSIS — R2681 Unsteadiness on feet: Secondary | ICD-10-CM | POA: Diagnosis not present

## 2023-06-25 NOTE — Therapy (Signed)
OUTPATIENT PHYSICAL THERAPY VESTIBULAR TREATMENT     Patient Name: JALEXUS PUHL MRN: [id number] DOB:01/07/1941, 82 y.o., female Today's Date: 06/25/2023  END OF SESSION:  PT End of Session - 06/25/23 1147     Visit Number 7    Number of Visits 9    Date for PT Re-Evaluation 06/29/23    Authorization Type [employer]    PT Start Time 1147    PT Stop Time 1231    PT Time Calculation (min) 44 min    Equipment Utilized During Treatment Gait belt    Activity Tolerance Patient tolerated treatment well    Behavior During Therapy WFL for tasks assessed/performed              Past Medical History:  Diagnosis Date   Arthritis    Blood transfusion 1967   after birth of 1st child   Cataracts, bilateral    immature   Diverticulosis    GERD (gastroesophageal reflux disease)    rarely but doesn't require meds   Hemorrhoids    History of colonic polyps    Hyperlipidemia    takes Simvastatin daily   Joint pain    Joint swelling    Nocturia    Seasonal allergies    takes Loratadine daily   Urinary frequency    Past Surgical History:  Procedure Laterality Date   anal polpys  2010   done in office   BREAST SURGERY  1975   right febrile tumor non malignant   CHOLECYSTECTOMY  2009   COLONOSCOPY     TONSILLECTOMY  1973   TOTAL KNEE ARTHROPLASTY  12/04/2011   Procedure: TOTAL KNEE ARTHROPLASTY;  Surgeon: Raymon Mutton, MD;  Location: [facility] OR;  Service: Orthopedics;  Laterality: Right;  right total knee arthroplasty   There are no problems to display for this patient.   PCP: Noberto Retort, MD  REFERRING PROVIDER: Deatra James, MD  REFERRING DIAG: R42 (ICD-10-CM) - Dizziness and giddiness   THERAPY DIAG:  Right Vestibular Hypofunction   ONSET DATE: 6 weeks ago; 04/19/23   Rationale for Evaluation and Treatment: Rehabilitation  SUBJECTIVE:   SUBJECTIVE STATEMENT: Patient reported she has not had any issues all week until this morning. Did well with  Thanksgiving cooking and being in busy spaces. Did not drive today but did drive once last week with no issues. Did her HEP this morning and it seemed to make dizziness worse. 3/5 dizziness at time of session.   Pt accompanied by: self; husband waiting in the car  PERTINENT HISTORY: cataracts, GERD, hyperlipidemia   PAIN:  Are you having pain? No  PRECAUTIONS: Fall  PATIENT GOALS: "get rid of the dizziness"   OBJECTIVE:  Note: Objective measures were completed at Evaluation unless otherwise noted.  DIAGNOSTIC FINDINGS: non-contributory   VESTIBULAR TREATMENT:                                                                                                    Blaze pods on mirror: - turn and find to R  - turn and find to L  -  on blue mat:  - turn and find to R   - turn and find to L   Parallel bars: - side stepping on purple beam  - tandem walking on purple beam   Ball toss: - turn and toss to R  - turn and toss to L   Access Code: [email or web address] URL: [email or web address] Date: 06/18/2023 Prepared by: Maebelle Munroe  Exercises - Standing Balance with Eyes OPEN, then CLOSED on Foam  - 1 x daily - 7 x weekly - 3 sets - 10 reps; and then add head turns with EO and with EC (see instructions in custom notes on HEP)  Progressed x1 viewing exercise to standing and to 60 secs  PATIENT EDUCATION: Education details:  educated pt on vestibular system - being both peripheral and central:  informed pt that her symptoms appeared to be due to vestibular hypofunction - of peripheral etiology, as primary PT had explained to pt.  HEP updated to include balance on foam - [employer] Person educated: Patient Education method: Explanation, Demonstration, and Handouts Education comprehension: verbalized understanding and returned demonstration  HOME EXERCISE PROGRAM:   Keeping eyes on target on wall ~3 feet away, tilt head down 15-30 and move head side to side for _30___ seconds.  Repeat while moving head up and down for _30___ seconds. Do __3__ sessions per day. Try to start with a plain background. Stop if dizziness increases by 2 points or if eyes are skipping off of target.   Access Code: [email or web address] URL: [email or web address] Date: 06/06/2023 Prepared by: Merry Lofty  Exercises - Standing Gaze Stabilization with Two Near Targets and Head Rotation  - 1 x daily - 7 x weekly - 3 sets - 30s hold - Gaze Stability (VOR) x2 with Static March with horizontal head turns  - 1 x daily - 7 x weekly - 3 sets - 30s hold  GOALS: Goals reviewed with patient? Yes  SHORT TERM GOALS: Target date: same as LTG    LONG TERM GOALS: Target date: 06/29/23  Pt will be independent with HEP for improved gaze stabilization and decreased dizziness.   Baseline: handout provided 11/7 Goal status: INITIAL  2.  Patient will complete >/= 30s on condition 4 of MCTSIB to demonstrate improved balance.  Baseline: to be assessed  Goal status: REVISED  3.  MSQ goal to be added  Baseline: not indicated based on results Goal status: DISCONTINUED  4.  FOTO goal to be added  Baseline: to be captured  Goal status: INITIAL   ASSESSMENT:  CLINICAL IMPRESSION: Patient see today for skilled physical therapy with emphasis on vestibular and balance training. Patient did well with multiple turning activities today. Reported no increase in difficulty with blaze pod activity with blue mat for unsteady surface. Side stepping and tandem walking on purple beam was challenging but attainable. Verbal cueing with ball toss activity to take bigger steps and turn faster for increased simulation of moving quickly around the kitchen. Patient is compliant with HEP. Continue POC, plan to discharge next session.   OBJECTIVE IMPAIRMENTS: decreased balance and dizziness.   ACTIVITY LIMITATIONS: bed mobility, locomotion, caring for others, bending down   PARTICIPATION LIMITATIONS: meal prep,  cleaning, laundry, and driving  PERSONAL FACTORS: Age, Sex, Transportation, and 3+ comorbidities: see above  are also affecting patient's functional outcome.   REHAB POTENTIAL: Good  CLINICAL DECISION MAKING: Stable/uncomplicated  EVALUATION COMPLEXITY: Low   PLAN:  PT FREQUENCY: 1-2x/week  PT DURATION: 4 weeks  PLANNED INTERVENTIONS: 54098- PT Re-evaluation,  97110-Therapeutic exercises, 97530- Therapeutic activity, [id number]- Neuromuscular re-education, [phone]- Self Care, 21308- Manual therapy, [phone]- Gait training, [phone]- Canalith repositioning, [phone]- Aquatic Therapy, Patient/Family education, Balance training, Stair training, Vestibular training, Visual/preceptual remediation/compensation, and DME instructions  PLAN FOR NEXT SESSION: Check updated HEP for any questions:  turning with blaze pods, unsteady surfaces, EC    Pilot Prindle M Azariyah Luhrs, SPT  06/25/2023, 12:50 PM

## 2023-06-27 ENCOUNTER — Ambulatory Visit: Payer: Medicare PPO

## 2023-06-27 DIAGNOSIS — R2681 Unsteadiness on feet: Secondary | ICD-10-CM

## 2023-06-27 DIAGNOSIS — R2689 Other abnormalities of gait and mobility: Secondary | ICD-10-CM | POA: Diagnosis not present

## 2023-06-27 DIAGNOSIS — R42 Dizziness and giddiness: Secondary | ICD-10-CM

## 2023-06-27 NOTE — Therapy (Signed)
OUTPATIENT PHYSICAL THERAPY VESTIBULAR TREATMENT - DISCHARGE SUMMARY     Patient Name: Yvonne Vargas MRN: [id number] DOB:1941/06/01, 82 y.o., female Today's Date: 06/27/2023  END OF SESSION:  PT End of Session - 06/27/23 1145     Visit Number 8    Number of Visits 9    Date for PT Re-Evaluation 06/29/23    Authorization Type [employer]    PT Start Time 1145    PT Stop Time 1206    PT Time Calculation (min) 21 min    Activity Tolerance Patient tolerated treatment well    Behavior During Therapy WFL for tasks assessed/performed              Past Medical History:  Diagnosis Date   Arthritis    Blood transfusion 1967   after birth of 1st child   Cataracts, bilateral    immature   Diverticulosis    GERD (gastroesophageal reflux disease)    rarely but doesn't require meds   Hemorrhoids    History of colonic polyps    Hyperlipidemia    takes Simvastatin daily   Joint pain    Joint swelling    Nocturia    Seasonal allergies    takes Loratadine daily   Urinary frequency    Past Surgical History:  Procedure Laterality Date   anal polpys  2010   done in office   BREAST SURGERY  1975   right febrile tumor non malignant   CHOLECYSTECTOMY  2009   COLONOSCOPY     TONSILLECTOMY  1973   TOTAL KNEE ARTHROPLASTY  12/04/2011   Procedure: TOTAL KNEE ARTHROPLASTY;  Surgeon: Raymon Mutton, MD;  Location: [facility] OR;  Service: Orthopedics;  Laterality: Right;  right total knee arthroplasty   There are no problems to display for this patient.   PCP: Noberto Retort, MD  REFERRING PROVIDER: Deatra James, MD  REFERRING DIAG: R42 (ICD-10-CM) - Dizziness and giddiness   THERAPY DIAG:  Right Vestibular Hypofunction   ONSET DATE: 6 weeks ago; 04/19/23   Rationale for Evaluation and Treatment: Rehabilitation   PHYSICAL THERAPY DISCHARGE SUMMARY  Visits from Start of Care: 8  Current functional level related to goals / functional outcomes: Met all Goals    Remaining deficits: Back to baseline   Education / Equipment: HEP    Patient agrees to discharge. Patient goals were met. Patient is being discharged due to being pleased with the current functional level.   SUBJECTIVE:   SUBJECTIVE STATEMENT: Patient reported doing well today, no dizziness today. No dizziness since session on Monday. Has driven several times this week with no issues.   Pt accompanied by: self; husband waiting in the car  PERTINENT HISTORY: cataracts, GERD, hyperlipidemia   PAIN:  Are you having pain? No  PRECAUTIONS: Fall  PATIENT GOALS: "get rid of the dizziness"   OBJECTIVE:  Note: Objective measures were completed at Evaluation unless otherwise noted.  DIAGNOSTIC FINDINGS: non-contributory   VESTIBULAR TREATMENT:     Checked Long Term Goals   Access Code: [email or web address] URL: [email or web address] Date: 06/18/2023 Prepared by: Maebelle Munroe  Exercises - Standing Balance with Eyes OPEN, then CLOSED on Foam  - 1 x daily - 7 x weekly - 3 sets - 10 reps; and then add head turns with EO and with EC (see instructions in custom notes on HEP)  Progressed x1 viewing exercise to standing and to 60 secs  PATIENT EDUCATION: Education details:  educated pt on vestibular system - being  both peripheral and central:  informed pt that her symptoms appeared to be due to vestibular hypofunction - of peripheral etiology, as primary PT had explained to pt.  HEP updated to include balance on foam - [employer] Person educated: Patient Education method: Explanation, Demonstration, and Handouts Education comprehension: verbalized understanding and returned demonstration  HOME EXERCISE PROGRAM:   Keeping eyes on target on wall ~3 feet away, tilt head down 15-30 and move head side to side for _30___ seconds. Repeat while moving head up and down for _30___ seconds. Do __3__ sessions per day. Try to start with a plain background. Stop if dizziness increases by 2  points or if eyes are skipping off of target.   Access Code: [email or web address] URL: [email or web address] Date: 06/06/2023 Prepared by: Merry Lofty  Exercises - Standing Gaze Stabilization with Two Near Targets and Head Rotation  - 1 x daily - 7 x weekly - 3 sets - 30s hold - Gaze Stability (VOR) x2 with Static March with horizontal head turns  - 1 x daily - 7 x weekly - 3 sets - 30s hold  GOALS: Goals reviewed with patient? Yes  SHORT TERM GOALS: Target date: same as LTG    LONG TERM GOALS: Target date: 06/29/23  Pt will be independent with HEP for improved gaze stabilization and decreased dizziness.   Baseline: handout provided 11/7 Goal status: MET  2.  Patient will complete >/= 30s on condition 4 of MCTSIB to demonstrate improved balance.  Baseline: 30 sec all conditions minimal sway  Goal status: MET  3.  MSQ goal to be added  Baseline: not indicated based on results Goal status: DISCONTINUED  4.  FOTO goal to be added  Baseline: to be captured  Goal status: DISCONTINUED   ASSESSMENT:  CLINICAL IMPRESSION: Patient see today for skilled physical therapy with emphasis on discharge from therapy. Patient met all of her Long Term Goals. She is very compliant with doing her HEP multiple times a day at home. Educated patient she can gradually dial back the amount she is doing her HEP and start back at the gym and build herself back up into her normal routine. She was able to complete MCTSIB with minimal to no sway. Patient agreeable to discharge at this time.   OBJECTIVE IMPAIRMENTS: decreased balance and dizziness.   ACTIVITY LIMITATIONS: bed mobility, locomotion, caring for others, bending down   PARTICIPATION LIMITATIONS: meal prep, cleaning, laundry, and driving  PERSONAL FACTORS: Age, Sex, Transportation, and 3+ comorbidities: see above  are also affecting patient's functional outcome.   REHAB POTENTIAL: Good  CLINICAL DECISION MAKING:  Stable/uncomplicated  EVALUATION COMPLEXITY: Low   PLAN:  PT FREQUENCY: 1-2x/week  PT DURATION: 4 weeks  PLANNED INTERVENTIONS: 97164- PT Re-evaluation, 97110-Therapeutic exercises, 97530- Therapeutic activity, 97112- Neuromuscular re-education, 97535- Self Care, 50093- Manual therapy, [phone]- Gait training, [phone]- Canalith repositioning, [phone]- Aquatic Therapy, Patient/Family education, Balance training, Stair training, Vestibular training, Visual/preceptual remediation/compensation, and DME instructions     Bradd Burner Letta Cargile, SPT  06/27/2023, 12:07 PM

## 2023-07-22 ENCOUNTER — Ambulatory Visit
Admission: RE | Admit: 2023-07-22 | Discharge: 2023-07-22 | Disposition: A | Discharge status: Home / Self Care | Payer: Medicare PPO | Source: Ambulatory Visit | Attending: Physician Assistant | Admitting: Physician Assistant

## 2023-07-22 ENCOUNTER — Ambulatory Visit (INDEPENDENT_AMBULATORY_CARE_PROVIDER_SITE_OTHER): Payer: Medicare PPO

## 2023-07-22 VITALS — BP 151/85 | HR 83 | Temp 98.0°F | Resp 18

## 2023-07-22 DIAGNOSIS — J40 Bronchitis, not specified as acute or chronic: Secondary | ICD-10-CM

## 2023-07-22 DIAGNOSIS — R051 Acute cough: Secondary | ICD-10-CM | POA: Diagnosis not present

## 2023-07-22 DIAGNOSIS — R059 Cough, unspecified: Secondary | ICD-10-CM | POA: Diagnosis not present

## 2023-07-22 DIAGNOSIS — J329 Chronic sinusitis, unspecified: Secondary | ICD-10-CM

## 2023-07-22 MED ORDER — DOXYCYCLINE HYCLATE 100 MG PO CAPS: 100.0000 mg | ORAL_CAPSULE | Freq: Two times a day (BID) | ORAL | 0 refills | Status: AC

## 2023-07-22 NOTE — Discharge Instructions (Signed)
We are treating you for sinobronchitis.  Start doxycycline 100 mg twice daily for 10 days.  Stay out of the sun while on this medication.  Continue over-the-counter medication including Mucinex, Flonase, Tylenol, gargling warm salt water.  I am still waiting for the radiologist to over read your x-ray and if they see a pneumonia I will contact you and start another antibiotic.  If you do not hear from you continue your current medication and treatment plan.  If your symptoms are not improving within a few days or if anything worsens please return for reevaluation.

## 2023-07-22 NOTE — ED Provider Notes (Signed)
[facility]    CSN: [id number] Arrival date & time: 07/22/23  1344      History   Chief Complaint Chief Complaint  Patient presents with   Cough    HPI Yvonne BLAUSER is a 82 y.o. female.   Patient presents today with a 10-day history of URI symptoms including cough and congestion.  Denies any fever, chest pain, shortness of breath, vomiting.  She has had some nausea but attributes this to the mucus.  She has tried Mucinex and multisymptom cold and flu medication without improvement of symptoms.  She denies any known sick contacts; reports that her brother was around 2 weeks before she got sick with a mild cough but does not believe that this is related.  She has taken a COVID test that was negative.  She denies any recent antibiotics or steroids.  She is eating and drinking normally.  She is able to sleep through the night.  She denies history of allergies, asthma, COPD, smoking, diabetes.    Past Medical History:  Diagnosis Date   Arthritis    Blood transfusion 1967   after birth of 1st child   Cataracts, bilateral    immature   Diverticulosis    GERD (gastroesophageal reflux disease)    rarely but doesn't require meds   Hemorrhoids    History of colonic polyps    Hyperlipidemia    takes Simvastatin daily   Joint pain    Joint swelling    Nocturia    Seasonal allergies    takes Loratadine daily   Urinary frequency     There are no active problems to display for this patient.   Past Surgical History:  Procedure Laterality Date   anal polpys  2010   done in office   BREAST SURGERY  1975   right febrile tumor non malignant   CHOLECYSTECTOMY  2009   COLONOSCOPY     TONSILLECTOMY  1973   TOTAL KNEE ARTHROPLASTY  12/04/2011   Procedure: TOTAL KNEE ARTHROPLASTY;  Surgeon: Raymon Mutton, MD;  Location: [facility] OR;  Service: Orthopedics;  Laterality: Right;  right total knee arthroplasty    OB History   No obstetric history on file.      Home  Medications    Prior to Admission medications   Medication Sig Start Date End Date Taking? Authorizing Provider  doxycycline (VIBRAMYCIN) 100 MG capsule Take 1 capsule (100 mg total) by mouth 2 (two) times daily. 07/22/23  Yes Laree Garron K, PA-C  albuterol (VENTOLIN HFA) 108 (90 Base) MCG/ACT inhaler Inhale 1-2 puffs into the lungs every 6 (six) hours as needed for wheezing or shortness of breath. 12/05/22   Radford Pax, NP  benzonatate (TESSALON) 100 MG capsule Take 1 capsule (100 mg total) by mouth every 8 (eight) hours. 12/05/22   Radford Pax, NP  Calcium Carbonate-Vitamin D (CALCIUM PLUS VITAMIN D PO) Take 1 tablet by mouth 2 (two) times daily.    [provider]  Cholecalciferol (VITAMIN D3) 2000 UNITS capsule Take 2,000 Units by mouth daily.    [provider]  enoxaparin (LOVENOX) 40 MG/0.4ML injection Inject 0.4 mLs (40 mg total) into the skin every 12 (twelve) hours. 12/06/11   Altamese Cabal, PA-C  loratadine (CLARITIN) 10 MG tablet Take 10 mg by mouth daily.    [provider]  oxyCODONE (OXYCONTIN) 10 MG 12 hr tablet Take 1 tablet (10 mg total) by mouth every 12 (twelve) hours. 12/06/11  Altamese Cabal, PA-C  simvastatin (ZOCOR) 40 MG tablet Take 40 mg by mouth every evening.    [provider]    Family History Family History  Problem Relation Age of Onset   Anesthesia problems Neg Hx    Hypotension Neg Hx    Malignant hyperthermia Neg Hx    Pseudochol deficiency Neg Hx     Social History Social History   Tobacco Use   Smoking status: Never  Substance Use Topics   Alcohol use: No   Drug use: No     Allergies   Clindamycin/lincomycin, Codeine, and Penicillins   Review of Systems Review of Systems  Constitutional:  Positive for activity change. Negative for appetite change, fatigue and fever.  HENT:  Positive for congestion and sore throat. Negative for sinus pressure and sneezing.   Respiratory:  Positive for cough.  Negative for shortness of breath.   Cardiovascular:  Negative for chest pain.  Gastrointestinal:  Positive for nausea. Negative for abdominal pain, diarrhea and vomiting.  Neurological:  Negative for dizziness, light-headedness and headaches.     Physical Exam Triage Vital Signs ED Triage Vitals [07/22/23 1446]  Encounter Vitals Group     BP (!) 151/85     Systolic BP Percentile      Diastolic BP Percentile      Pulse Rate 83     Resp 18     Temp 98 F (36.7 C)     Temp Source Oral     SpO2 94 %     Weight      Height      Head Circumference      Peak Flow      Pain Score 0     Pain Loc      Pain Education      Exclude from Growth Chart    No data found.  Updated Vital Signs BP (!) 151/85 (BP Location: Left Arm)   Pulse 83   Temp 98 F (36.7 C) (Oral)   Resp 18   SpO2 94%   Visual Acuity Right Eye Distance:   Left Eye Distance:   Bilateral Distance:    Right Eye Near:   Left Eye Near:    Bilateral Near:     Physical Exam Vitals reviewed.  Constitutional:      General: She is awake. She is not in acute distress.    Appearance: Normal appearance. She is well-developed. She is not ill-appearing.     Comments: Very pleasant female appears stated age in no acute distress sitting comfortably in exam room  HENT:     Head: Normocephalic and atraumatic.     Right Ear: Tympanic membrane, ear canal and external ear normal. Tympanic membrane is not erythematous or bulging.     Left Ear: Tympanic membrane, ear canal and external ear normal. Tympanic membrane is not erythematous or bulging.     Nose: Nose normal.     Mouth/Throat:     Pharynx: Uvula midline. No oropharyngeal exudate or posterior oropharyngeal erythema.  Cardiovascular:     Rate and Rhythm: Normal rate and regular rhythm.     Heart sounds: Normal heart sounds, S1 normal and S2 normal. No murmur heard. Pulmonary:     Effort: Pulmonary effort is normal.     Breath sounds: Examination of the  right-lower field reveals decreased breath sounds. Decreased breath sounds present. No wheezing, rhonchi or rales.  Psychiatric:        Behavior: Behavior is cooperative.  UC Treatments / Results  Labs (all labs ordered are listed, but only abnormal results are displayed) Labs Reviewed - No data to display  EKG   Radiology No results found.  Procedures Procedures (including critical care time)  Medications Ordered in UC Medications - No data to display  Initial Impression / Assessment and Plan / UC Course  I have reviewed the triage vital signs and the nursing notes.  Pertinent labs & imaging results that were available during my care of the patient were reviewed by me and considered in my medical decision making (see chart for details).     Patient is well-appearing, afebrile, nontoxic, nontachycardic.  No indication for viral testing given she has been symptomatic for over a week and this would not change management.  Chest x-ray was obtained with no evidence of acute cardiopulmonary disease based on my primary read.  At the time of discharge we were waiting for radiologist over read and we will contact her if this differs and changes our treatment plan.  Given prolonged and worsening symptoms will cover with doxycycline 100 mg twice daily for 10 days.  She was instructed to avoid prolonged sun exposure while on this medication due to photosensitivity.  She can use over-the-counter medication for symptom management.  If her symptoms are not improving within a week she is to return for reevaluation.  Discussed that if she has any worsening symptoms she needs to be seen immediately.  Strict return precautions given.  Recommend close follow-up with primary care.  Final Clinical Impressions(s) / UC Diagnoses   Final diagnoses:  Acute cough  Sinobronchitis     Discharge Instructions      We are treating you for sinobronchitis.  Start doxycycline 100 mg twice daily for 10  days.  Stay out of the sun while on this medication.  Continue over-the-counter medication including Mucinex, Flonase, Tylenol, gargling warm salt water.  I am still waiting for the radiologist to over read your x-ray and if they see a pneumonia I will contact you and start another antibiotic.  If you do not hear from you continue your current medication and treatment plan.  If your symptoms are not improving within a few days or if anything worsens please return for reevaluation.     ED Prescriptions     Medication Sig Dispense Auth. Provider   doxycycline (VIBRAMYCIN) 100 MG capsule Take 1 capsule (100 mg total) by mouth 2 (two) times daily. 20 capsule Payzlee Ryder, Noberto Retort, PA-C      PDMP not reviewed this encounter.   Jeani Hawking, PA-C 07/22/23 1610

## 2023-07-22 NOTE — ED Triage Notes (Signed)
Pt with cough and congestion for about 1 week. States she can cough up mucus but can't get it past her throat and ends up swallowing it.

## 2023-07-26 ENCOUNTER — Telehealth: Payer: Self-pay

## 2023-08-22 DIAGNOSIS — H40033 Anatomical narrow angle, bilateral: Secondary | ICD-10-CM | POA: Diagnosis not present

## 2023-08-22 DIAGNOSIS — H40013 Open angle with borderline findings, low risk, bilateral: Secondary | ICD-10-CM | POA: Diagnosis not present

## 2023-08-22 DIAGNOSIS — H25813 Combined forms of age-related cataract, bilateral: Secondary | ICD-10-CM | POA: Diagnosis not present

## 2023-08-22 DIAGNOSIS — R7309 Other abnormal glucose: Secondary | ICD-10-CM | POA: Diagnosis not present

## 2023-08-31 DIAGNOSIS — E559 Vitamin D deficiency, unspecified: Secondary | ICD-10-CM | POA: Diagnosis not present

## 2023-08-31 DIAGNOSIS — R7303 Prediabetes: Secondary | ICD-10-CM | POA: Diagnosis not present

## 2023-08-31 DIAGNOSIS — E7800 Pure hypercholesterolemia, unspecified: Secondary | ICD-10-CM | POA: Diagnosis not present

## 2023-12-03 DIAGNOSIS — Z1231 Encounter for screening mammogram for malignant neoplasm of breast: Secondary | ICD-10-CM | POA: Diagnosis not present

## 2024-02-22 DIAGNOSIS — H40013 Open angle with borderline findings, low risk, bilateral: Secondary | ICD-10-CM | POA: Diagnosis not present

## 2024-03-05 DIAGNOSIS — E559 Vitamin D deficiency, unspecified: Secondary | ICD-10-CM | POA: Diagnosis not present

## 2024-03-05 DIAGNOSIS — Z0000 Encounter for general adult medical examination without abnormal findings: Secondary | ICD-10-CM | POA: Diagnosis not present

## 2024-03-05 DIAGNOSIS — M810 Age-related osteoporosis without current pathological fracture: Secondary | ICD-10-CM | POA: Diagnosis not present

## 2024-03-05 DIAGNOSIS — E7800 Pure hypercholesterolemia, unspecified: Secondary | ICD-10-CM | POA: Diagnosis not present

## 2024-03-05 DIAGNOSIS — R7303 Prediabetes: Secondary | ICD-10-CM | POA: Diagnosis not present

## 2024-04-04 DIAGNOSIS — M810 Age-related osteoporosis without current pathological fracture: Secondary | ICD-10-CM | POA: Diagnosis not present
